# Patient Record
Sex: Female | Born: 1955 | Race: Black or African American | Hispanic: No | Smoking: Never smoker
Health system: Southern US, Community
[De-identification: ages and names within clinical notes are randomized; demographics above are authoritative.]

## PROBLEM LIST (undated history)

## (undated) DIAGNOSIS — K760 Fatty (change of) liver, not elsewhere classified: Secondary | ICD-10-CM

## (undated) DIAGNOSIS — E1143 Type 2 diabetes mellitus with diabetic autonomic (poly)neuropathy: Secondary | ICD-10-CM

## (undated) DIAGNOSIS — G4733 Obstructive sleep apnea (adult) (pediatric): Secondary | ICD-10-CM

## (undated) DIAGNOSIS — K3184 Gastroparesis: Secondary | ICD-10-CM

## (undated) DIAGNOSIS — E785 Hyperlipidemia, unspecified: Secondary | ICD-10-CM

## (undated) DIAGNOSIS — I10 Essential (primary) hypertension: Secondary | ICD-10-CM

## (undated) DIAGNOSIS — E669 Obesity, unspecified: Secondary | ICD-10-CM

## (undated) HISTORY — DX: Fatty (change of) liver, not elsewhere classified: K76.0

## (undated) HISTORY — DX: Obstructive sleep apnea (adult) (pediatric): G47.33

## (undated) HISTORY — DX: Hyperlipidemia, unspecified: E78.5

## (undated) HISTORY — PX: OTHER SURGICAL HISTORY: SHX169

## (undated) HISTORY — DX: Essential (primary) hypertension: I10

## (undated) HISTORY — DX: Gastroparesis: K31.84

## (undated) HISTORY — PX: TONSILLECTOMY: SUR1361

## (undated) HISTORY — DX: Type 2 diabetes mellitus with diabetic autonomic (poly)neuropathy: E11.43

## (undated) HISTORY — DX: Obesity, unspecified: E66.9

---

## 1998-08-15 ENCOUNTER — Emergency Department (HOSPITAL_COMMUNITY): Admission: EM | Admit: 1998-08-15 | Discharge: 1998-08-15 | Payer: Self-pay | Admitting: *Deleted

## 1998-08-22 ENCOUNTER — Encounter: Admission: RE | Admit: 1998-08-22 | Discharge: 1998-08-22 | Payer: Self-pay | Admitting: Internal Medicine

## 1998-08-29 ENCOUNTER — Encounter: Admission: RE | Admit: 1998-08-29 | Discharge: 1998-08-29 | Payer: Self-pay | Admitting: Internal Medicine

## 1998-09-12 ENCOUNTER — Encounter: Admission: RE | Admit: 1998-09-12 | Discharge: 1998-09-12 | Payer: Self-pay | Admitting: Internal Medicine

## 1998-09-16 ENCOUNTER — Ambulatory Visit (HOSPITAL_COMMUNITY): Admission: RE | Admit: 1998-09-16 | Discharge: 1998-09-16 | Payer: Self-pay | Admitting: Internal Medicine

## 1998-09-16 ENCOUNTER — Encounter: Payer: Self-pay | Admitting: Internal Medicine

## 1998-09-20 ENCOUNTER — Encounter: Admission: RE | Admit: 1998-09-20 | Discharge: 1998-09-20 | Payer: Self-pay | Admitting: Obstetrics & Gynecology

## 1998-09-26 ENCOUNTER — Encounter: Admission: RE | Admit: 1998-09-26 | Discharge: 1998-12-25 | Payer: Self-pay | Admitting: *Deleted

## 1998-10-17 ENCOUNTER — Encounter: Admission: RE | Admit: 1998-10-17 | Discharge: 1998-10-17 | Payer: Self-pay | Admitting: Internal Medicine

## 1998-10-31 ENCOUNTER — Encounter: Admission: RE | Admit: 1998-10-31 | Discharge: 1998-10-31 | Payer: Self-pay | Admitting: Internal Medicine

## 1998-11-14 ENCOUNTER — Encounter: Admission: RE | Admit: 1998-11-14 | Discharge: 1998-11-14 | Payer: Self-pay | Admitting: Hematology and Oncology

## 1998-12-13 ENCOUNTER — Other Ambulatory Visit: Admission: RE | Admit: 1998-12-13 | Discharge: 1998-12-13 | Payer: Self-pay | Admitting: *Deleted

## 1998-12-13 ENCOUNTER — Encounter: Admission: RE | Admit: 1998-12-13 | Discharge: 1998-12-13 | Payer: Self-pay | Admitting: Obstetrics & Gynecology

## 1998-12-19 ENCOUNTER — Encounter: Admission: RE | Admit: 1998-12-19 | Discharge: 1998-12-19 | Payer: Self-pay | Admitting: Internal Medicine

## 1999-03-16 ENCOUNTER — Encounter: Admission: RE | Admit: 1999-03-16 | Discharge: 1999-03-16 | Payer: Self-pay | Admitting: Internal Medicine

## 1999-04-11 ENCOUNTER — Encounter: Admission: RE | Admit: 1999-04-11 | Discharge: 1999-04-11 | Payer: Self-pay | Admitting: Obstetrics & Gynecology

## 1999-04-17 ENCOUNTER — Encounter: Admission: RE | Admit: 1999-04-17 | Discharge: 1999-04-17 | Payer: Self-pay | Admitting: Internal Medicine

## 1999-04-19 ENCOUNTER — Ambulatory Visit (HOSPITAL_COMMUNITY): Admission: RE | Admit: 1999-04-19 | Discharge: 1999-04-19 | Payer: Self-pay | Admitting: Emergency Medicine

## 1999-04-19 ENCOUNTER — Encounter: Payer: Self-pay | Admitting: Internal Medicine

## 1999-04-25 ENCOUNTER — Encounter: Admission: RE | Admit: 1999-04-25 | Discharge: 1999-04-25 | Payer: Self-pay | Admitting: Hematology and Oncology

## 1999-05-02 ENCOUNTER — Encounter: Admission: RE | Admit: 1999-05-02 | Discharge: 1999-05-02 | Payer: Self-pay | Admitting: Internal Medicine

## 1999-05-09 ENCOUNTER — Encounter: Admission: RE | Admit: 1999-05-09 | Discharge: 1999-05-09 | Payer: Self-pay | Admitting: Internal Medicine

## 1999-07-10 ENCOUNTER — Encounter: Admission: RE | Admit: 1999-07-10 | Discharge: 1999-07-10 | Payer: Self-pay | Admitting: Internal Medicine

## 1999-08-01 ENCOUNTER — Encounter: Admission: RE | Admit: 1999-08-01 | Discharge: 1999-08-01 | Payer: Self-pay | Admitting: Emergency Medicine

## 1999-08-23 ENCOUNTER — Encounter: Admission: RE | Admit: 1999-08-23 | Discharge: 1999-08-23 | Payer: Self-pay | Admitting: Hematology and Oncology

## 1999-08-23 ENCOUNTER — Encounter: Payer: Self-pay | Admitting: Hematology and Oncology

## 1999-08-23 ENCOUNTER — Ambulatory Visit (HOSPITAL_COMMUNITY): Admission: RE | Admit: 1999-08-23 | Discharge: 1999-08-23 | Payer: Self-pay | Admitting: Hematology and Oncology

## 1999-08-31 ENCOUNTER — Encounter: Admission: RE | Admit: 1999-08-31 | Discharge: 1999-08-31 | Payer: Self-pay | Admitting: Hematology and Oncology

## 1999-09-21 ENCOUNTER — Encounter: Admission: RE | Admit: 1999-09-21 | Discharge: 1999-09-21 | Payer: Self-pay | Admitting: Internal Medicine

## 1999-09-28 ENCOUNTER — Encounter: Admission: RE | Admit: 1999-09-28 | Discharge: 1999-09-28 | Payer: Self-pay | Admitting: Hematology and Oncology

## 1999-10-11 ENCOUNTER — Ambulatory Visit (HOSPITAL_COMMUNITY): Admission: RE | Admit: 1999-10-11 | Discharge: 1999-10-11 | Payer: Self-pay | Admitting: Internal Medicine

## 1999-11-08 ENCOUNTER — Encounter: Admission: RE | Admit: 1999-11-08 | Discharge: 1999-11-08 | Payer: Self-pay | Admitting: Internal Medicine

## 1999-11-28 ENCOUNTER — Emergency Department (HOSPITAL_COMMUNITY): Admission: EM | Admit: 1999-11-28 | Discharge: 1999-11-28 | Payer: Self-pay | Admitting: Emergency Medicine

## 1999-11-30 ENCOUNTER — Encounter: Admission: RE | Admit: 1999-11-30 | Discharge: 1999-11-30 | Payer: Self-pay

## 1999-12-07 ENCOUNTER — Encounter: Admission: RE | Admit: 1999-12-07 | Discharge: 1999-12-07 | Payer: Self-pay | Admitting: Hematology and Oncology

## 1999-12-12 ENCOUNTER — Encounter: Admission: RE | Admit: 1999-12-12 | Discharge: 1999-12-12 | Payer: Self-pay | Admitting: Hematology and Oncology

## 1999-12-15 ENCOUNTER — Encounter: Admission: RE | Admit: 1999-12-15 | Discharge: 1999-12-15 | Payer: Self-pay

## 1999-12-18 ENCOUNTER — Encounter (INDEPENDENT_AMBULATORY_CARE_PROVIDER_SITE_OTHER): Payer: Self-pay | Admitting: Specialist

## 1999-12-18 ENCOUNTER — Encounter: Payer: Self-pay | Admitting: Gastroenterology

## 1999-12-18 ENCOUNTER — Ambulatory Visit (HOSPITAL_COMMUNITY): Admission: RE | Admit: 1999-12-18 | Discharge: 1999-12-18 | Payer: Self-pay | Admitting: Gastroenterology

## 1999-12-22 ENCOUNTER — Encounter: Admission: RE | Admit: 1999-12-22 | Discharge: 1999-12-22 | Payer: Self-pay | Admitting: Internal Medicine

## 2000-01-24 ENCOUNTER — Encounter: Admission: RE | Admit: 2000-01-24 | Discharge: 2000-01-24 | Payer: Self-pay

## 2000-02-15 ENCOUNTER — Other Ambulatory Visit: Admission: RE | Admit: 2000-02-15 | Discharge: 2000-02-15 | Payer: Self-pay | Admitting: Obstetrics

## 2000-02-15 ENCOUNTER — Encounter: Admission: RE | Admit: 2000-02-15 | Discharge: 2000-02-15 | Payer: Self-pay | Admitting: Obstetrics

## 2000-04-10 ENCOUNTER — Encounter: Admission: RE | Admit: 2000-04-10 | Discharge: 2000-04-10 | Payer: Self-pay | Admitting: Hematology and Oncology

## 2000-05-15 ENCOUNTER — Encounter: Admission: RE | Admit: 2000-05-15 | Discharge: 2000-05-15 | Payer: Self-pay | Admitting: Hematology and Oncology

## 2000-08-06 ENCOUNTER — Other Ambulatory Visit: Admission: RE | Admit: 2000-08-06 | Discharge: 2000-08-06 | Payer: Self-pay | Admitting: Family Medicine

## 2000-08-06 ENCOUNTER — Encounter: Admission: RE | Admit: 2000-08-06 | Discharge: 2000-08-06 | Payer: Self-pay | Admitting: Obstetrics & Gynecology

## 2000-08-06 DIAGNOSIS — R8789 Other abnormal findings in specimens from female genital organs: Secondary | ICD-10-CM

## 2000-08-19 ENCOUNTER — Ambulatory Visit (HOSPITAL_COMMUNITY): Admission: RE | Admit: 2000-08-19 | Discharge: 2000-08-19 | Payer: Self-pay | Admitting: Internal Medicine

## 2000-08-19 ENCOUNTER — Encounter: Admission: RE | Admit: 2000-08-19 | Discharge: 2000-08-19 | Payer: Self-pay | Admitting: Internal Medicine

## 2000-08-19 ENCOUNTER — Encounter: Payer: Self-pay | Admitting: Obstetrics & Gynecology

## 2000-09-06 ENCOUNTER — Other Ambulatory Visit: Admission: RE | Admit: 2000-09-06 | Discharge: 2000-09-06 | Payer: Self-pay | Admitting: Obstetrics & Gynecology

## 2000-09-06 ENCOUNTER — Encounter: Admission: RE | Admit: 2000-09-06 | Discharge: 2000-09-06 | Payer: Self-pay | Admitting: Obstetrics & Gynecology

## 2000-10-31 ENCOUNTER — Ambulatory Visit (HOSPITAL_COMMUNITY): Admission: RE | Admit: 2000-10-31 | Discharge: 2000-10-31 | Payer: Self-pay | Admitting: Obstetrics & Gynecology

## 2000-11-22 ENCOUNTER — Encounter: Admission: RE | Admit: 2000-11-22 | Discharge: 2000-11-22 | Payer: Self-pay | Admitting: Obstetrics & Gynecology

## 2000-11-25 ENCOUNTER — Encounter: Payer: Self-pay | Admitting: Obstetrics & Gynecology

## 2000-11-25 ENCOUNTER — Ambulatory Visit (HOSPITAL_COMMUNITY): Admission: RE | Admit: 2000-11-25 | Discharge: 2000-11-25 | Payer: Self-pay | Admitting: Obstetrics & Gynecology

## 2001-05-06 HISTORY — PX: ABDOMINAL HYSTERECTOMY: SHX81

## 2001-05-08 ENCOUNTER — Encounter: Admission: RE | Admit: 2001-05-08 | Discharge: 2001-05-08 | Payer: Self-pay | Admitting: *Deleted

## 2001-05-10 ENCOUNTER — Encounter: Payer: Self-pay | Admitting: Emergency Medicine

## 2001-05-10 ENCOUNTER — Emergency Department (HOSPITAL_COMMUNITY): Admission: EM | Admit: 2001-05-10 | Discharge: 2001-05-10 | Payer: Self-pay

## 2001-05-22 ENCOUNTER — Ambulatory Visit (HOSPITAL_COMMUNITY): Admission: RE | Admit: 2001-05-22 | Discharge: 2001-05-22 | Payer: Self-pay | Admitting: Internal Medicine

## 2001-06-06 ENCOUNTER — Encounter: Admission: RE | Admit: 2001-06-06 | Discharge: 2001-06-06 | Payer: Self-pay | Admitting: *Deleted

## 2001-06-06 ENCOUNTER — Other Ambulatory Visit: Admission: RE | Admit: 2001-06-06 | Discharge: 2001-06-06 | Payer: Self-pay | Admitting: *Deleted

## 2001-06-06 ENCOUNTER — Encounter (INDEPENDENT_AMBULATORY_CARE_PROVIDER_SITE_OTHER): Payer: Self-pay | Admitting: Specialist

## 2001-06-13 ENCOUNTER — Encounter: Admission: RE | Admit: 2001-06-13 | Discharge: 2001-06-13 | Payer: Self-pay | Admitting: *Deleted

## 2001-06-13 ENCOUNTER — Ambulatory Visit (HOSPITAL_COMMUNITY): Admission: RE | Admit: 2001-06-13 | Discharge: 2001-06-13 | Payer: Self-pay | Admitting: Internal Medicine

## 2001-06-19 ENCOUNTER — Encounter: Admission: RE | Admit: 2001-06-19 | Discharge: 2001-06-19 | Payer: Self-pay | Admitting: Obstetrics and Gynecology

## 2001-07-07 ENCOUNTER — Encounter: Admission: RE | Admit: 2001-07-07 | Discharge: 2001-07-07 | Payer: Self-pay | Admitting: Internal Medicine

## 2001-07-15 ENCOUNTER — Encounter: Admission: RE | Admit: 2001-07-15 | Discharge: 2001-07-15 | Payer: Self-pay | Admitting: *Deleted

## 2001-07-15 ENCOUNTER — Encounter: Admission: RE | Admit: 2001-07-15 | Discharge: 2001-07-15 | Payer: Self-pay | Admitting: Internal Medicine

## 2001-08-21 ENCOUNTER — Ambulatory Visit (HOSPITAL_COMMUNITY): Admission: RE | Admit: 2001-08-21 | Discharge: 2001-08-21 | Payer: Self-pay | Admitting: Obstetrics and Gynecology

## 2001-08-21 ENCOUNTER — Encounter: Admission: RE | Admit: 2001-08-21 | Discharge: 2001-08-21 | Payer: Self-pay | Admitting: Obstetrics and Gynecology

## 2001-08-22 ENCOUNTER — Ambulatory Visit (HOSPITAL_COMMUNITY): Admission: RE | Admit: 2001-08-22 | Discharge: 2001-08-22 | Payer: Self-pay | Admitting: Obstetrics

## 2001-08-28 ENCOUNTER — Inpatient Hospital Stay (HOSPITAL_COMMUNITY): Admission: RE | Admit: 2001-08-28 | Discharge: 2001-09-02 | Payer: Self-pay | Admitting: Obstetrics and Gynecology

## 2001-08-28 ENCOUNTER — Encounter (INDEPENDENT_AMBULATORY_CARE_PROVIDER_SITE_OTHER): Payer: Self-pay

## 2001-08-30 ENCOUNTER — Encounter: Payer: Self-pay | Admitting: Obstetrics and Gynecology

## 2001-09-01 ENCOUNTER — Encounter: Payer: Self-pay | Admitting: Obstetrics and Gynecology

## 2001-09-07 ENCOUNTER — Encounter: Payer: Self-pay | Admitting: Emergency Medicine

## 2001-09-07 ENCOUNTER — Emergency Department (HOSPITAL_COMMUNITY): Admission: EM | Admit: 2001-09-07 | Discharge: 2001-09-07 | Payer: Self-pay | Admitting: Emergency Medicine

## 2001-09-18 ENCOUNTER — Encounter: Admission: RE | Admit: 2001-09-18 | Discharge: 2001-09-18 | Payer: Self-pay | Admitting: Obstetrics and Gynecology

## 2001-10-03 ENCOUNTER — Encounter: Admission: RE | Admit: 2001-10-03 | Discharge: 2001-10-03 | Payer: Self-pay | Admitting: *Deleted

## 2001-10-31 ENCOUNTER — Encounter: Admission: RE | Admit: 2001-10-31 | Discharge: 2001-10-31 | Payer: Self-pay | Admitting: Internal Medicine

## 2001-12-19 ENCOUNTER — Encounter: Payer: Self-pay | Admitting: *Deleted

## 2001-12-19 ENCOUNTER — Emergency Department (HOSPITAL_COMMUNITY): Admission: EM | Admit: 2001-12-19 | Discharge: 2001-12-19 | Payer: Self-pay | Admitting: *Deleted

## 2001-12-22 ENCOUNTER — Encounter: Admission: RE | Admit: 2001-12-22 | Discharge: 2001-12-22 | Payer: Self-pay | Admitting: Internal Medicine

## 2002-05-09 ENCOUNTER — Emergency Department (HOSPITAL_COMMUNITY): Admission: EM | Admit: 2002-05-09 | Discharge: 2002-05-09 | Payer: Self-pay | Admitting: *Deleted

## 2002-05-13 ENCOUNTER — Encounter: Admission: RE | Admit: 2002-05-13 | Discharge: 2002-05-13 | Payer: Self-pay | Admitting: Internal Medicine

## 2002-07-02 ENCOUNTER — Encounter: Admission: RE | Admit: 2002-07-02 | Discharge: 2002-07-02 | Payer: Self-pay | Admitting: Internal Medicine

## 2002-07-23 ENCOUNTER — Ambulatory Visit (HOSPITAL_BASED_OUTPATIENT_CLINIC_OR_DEPARTMENT_OTHER): Admission: RE | Admit: 2002-07-23 | Discharge: 2002-07-23 | Payer: Self-pay | Admitting: Anesthesiology

## 2002-07-23 ENCOUNTER — Encounter: Admission: RE | Admit: 2002-07-23 | Discharge: 2002-07-23 | Payer: Self-pay | Admitting: Internal Medicine

## 2002-08-24 ENCOUNTER — Ambulatory Visit (HOSPITAL_COMMUNITY): Admission: RE | Admit: 2002-08-24 | Discharge: 2002-08-24 | Payer: Self-pay | Admitting: Internal Medicine

## 2002-10-30 ENCOUNTER — Encounter: Admission: RE | Admit: 2002-10-30 | Discharge: 2002-10-30 | Payer: Self-pay | Admitting: Internal Medicine

## 2002-12-10 ENCOUNTER — Encounter: Admission: RE | Admit: 2002-12-10 | Discharge: 2002-12-10 | Payer: Self-pay | Admitting: Internal Medicine

## 2003-04-07 ENCOUNTER — Encounter: Admission: RE | Admit: 2003-04-07 | Discharge: 2003-04-07 | Payer: Self-pay | Admitting: Internal Medicine

## 2003-04-14 ENCOUNTER — Encounter: Admission: RE | Admit: 2003-04-14 | Discharge: 2003-04-14 | Payer: Self-pay | Admitting: Internal Medicine

## 2003-06-17 ENCOUNTER — Encounter: Admission: RE | Admit: 2003-06-17 | Discharge: 2003-06-17 | Payer: Self-pay | Admitting: Internal Medicine

## 2003-07-13 ENCOUNTER — Ambulatory Visit (HOSPITAL_COMMUNITY): Admission: RE | Admit: 2003-07-13 | Discharge: 2003-07-13 | Payer: Self-pay | Admitting: Internal Medicine

## 2003-07-22 ENCOUNTER — Encounter: Payer: Self-pay | Admitting: Cardiology

## 2003-07-22 ENCOUNTER — Ambulatory Visit (HOSPITAL_COMMUNITY): Admission: RE | Admit: 2003-07-22 | Discharge: 2003-07-22 | Payer: Self-pay | Admitting: Family Medicine

## 2003-07-29 ENCOUNTER — Encounter: Admission: RE | Admit: 2003-07-29 | Discharge: 2003-07-29 | Payer: Self-pay | Admitting: Internal Medicine

## 2003-08-12 ENCOUNTER — Encounter: Admission: RE | Admit: 2003-08-12 | Discharge: 2003-08-12 | Payer: Self-pay | Admitting: Internal Medicine

## 2003-08-25 ENCOUNTER — Ambulatory Visit (HOSPITAL_COMMUNITY): Admission: RE | Admit: 2003-08-25 | Discharge: 2003-08-25 | Payer: Self-pay | Admitting: Internal Medicine

## 2003-09-01 ENCOUNTER — Emergency Department (HOSPITAL_COMMUNITY): Admission: EM | Admit: 2003-09-01 | Discharge: 2003-09-01 | Payer: Self-pay | Admitting: Family Medicine

## 2003-09-15 ENCOUNTER — Ambulatory Visit (HOSPITAL_COMMUNITY): Admission: RE | Admit: 2003-09-15 | Discharge: 2003-09-16 | Payer: Self-pay | Admitting: Otolaryngology

## 2003-09-15 ENCOUNTER — Encounter (INDEPENDENT_AMBULATORY_CARE_PROVIDER_SITE_OTHER): Payer: Self-pay | Admitting: *Deleted

## 2003-09-30 ENCOUNTER — Emergency Department (HOSPITAL_COMMUNITY): Admission: EM | Admit: 2003-09-30 | Discharge: 2003-09-30 | Payer: Self-pay | Admitting: Emergency Medicine

## 2003-10-18 ENCOUNTER — Ambulatory Visit: Payer: Self-pay | Admitting: Internal Medicine

## 2003-10-19 ENCOUNTER — Ambulatory Visit: Payer: Self-pay | Admitting: Internal Medicine

## 2003-11-17 ENCOUNTER — Ambulatory Visit (HOSPITAL_COMMUNITY): Admission: RE | Admit: 2003-11-17 | Discharge: 2003-11-17 | Payer: Self-pay | Admitting: Internal Medicine

## 2003-11-17 ENCOUNTER — Ambulatory Visit: Payer: Self-pay | Admitting: Internal Medicine

## 2003-11-19 ENCOUNTER — Ambulatory Visit (HOSPITAL_BASED_OUTPATIENT_CLINIC_OR_DEPARTMENT_OTHER): Admission: RE | Admit: 2003-11-19 | Discharge: 2003-11-19 | Payer: Self-pay | Admitting: Otolaryngology

## 2004-02-10 ENCOUNTER — Ambulatory Visit: Payer: Self-pay | Admitting: Internal Medicine

## 2004-02-21 ENCOUNTER — Ambulatory Visit: Payer: Self-pay | Admitting: Internal Medicine

## 2004-04-17 ENCOUNTER — Encounter: Admission: RE | Admit: 2004-04-17 | Discharge: 2004-04-17 | Payer: Self-pay | Admitting: Otolaryngology

## 2004-07-07 ENCOUNTER — Ambulatory Visit: Payer: Self-pay | Admitting: Internal Medicine

## 2004-07-21 ENCOUNTER — Ambulatory Visit: Payer: Self-pay | Admitting: Internal Medicine

## 2004-08-24 ENCOUNTER — Ambulatory Visit (HOSPITAL_COMMUNITY): Admission: RE | Admit: 2004-08-24 | Discharge: 2004-08-24 | Payer: Self-pay | Admitting: Obstetrics & Gynecology

## 2004-09-07 ENCOUNTER — Ambulatory Visit: Payer: Self-pay | Admitting: Internal Medicine

## 2004-09-28 ENCOUNTER — Ambulatory Visit: Payer: Self-pay | Admitting: Internal Medicine

## 2004-12-21 ENCOUNTER — Ambulatory Visit: Payer: Self-pay | Admitting: Internal Medicine

## 2004-12-26 ENCOUNTER — Ambulatory Visit: Payer: Self-pay | Admitting: Gastroenterology

## 2005-01-18 ENCOUNTER — Ambulatory Visit: Payer: Self-pay | Admitting: Internal Medicine

## 2005-01-23 ENCOUNTER — Ambulatory Visit: Payer: Self-pay | Admitting: Gastroenterology

## 2005-04-12 ENCOUNTER — Ambulatory Visit: Payer: Self-pay | Admitting: Internal Medicine

## 2005-10-02 ENCOUNTER — Ambulatory Visit: Payer: Self-pay | Admitting: Hospitalist

## 2005-10-19 ENCOUNTER — Ambulatory Visit: Payer: Self-pay | Admitting: Internal Medicine

## 2005-11-01 ENCOUNTER — Ambulatory Visit (HOSPITAL_COMMUNITY): Admission: RE | Admit: 2005-11-01 | Discharge: 2005-11-01 | Payer: Self-pay | Admitting: Internal Medicine

## 2005-11-01 ENCOUNTER — Ambulatory Visit: Payer: Self-pay | Admitting: Gastroenterology

## 2005-11-08 ENCOUNTER — Ambulatory Visit: Payer: Self-pay | Admitting: Internal Medicine

## 2005-11-09 DIAGNOSIS — J309 Allergic rhinitis, unspecified: Secondary | ICD-10-CM | POA: Insufficient documentation

## 2005-11-09 DIAGNOSIS — I10 Essential (primary) hypertension: Secondary | ICD-10-CM | POA: Insufficient documentation

## 2005-11-09 DIAGNOSIS — K219 Gastro-esophageal reflux disease without esophagitis: Secondary | ICD-10-CM | POA: Insufficient documentation

## 2005-11-09 DIAGNOSIS — G4730 Sleep apnea, unspecified: Secondary | ICD-10-CM | POA: Insufficient documentation

## 2005-11-09 DIAGNOSIS — E785 Hyperlipidemia, unspecified: Secondary | ICD-10-CM | POA: Insufficient documentation

## 2005-11-09 DIAGNOSIS — E669 Obesity, unspecified: Secondary | ICD-10-CM

## 2005-11-09 DIAGNOSIS — K7689 Other specified diseases of liver: Secondary | ICD-10-CM

## 2005-11-09 DIAGNOSIS — Z9889 Other specified postprocedural states: Secondary | ICD-10-CM

## 2005-11-09 DIAGNOSIS — E781 Pure hyperglyceridemia: Secondary | ICD-10-CM

## 2005-11-09 DIAGNOSIS — Z8719 Personal history of other diseases of the digestive system: Secondary | ICD-10-CM

## 2005-11-29 ENCOUNTER — Ambulatory Visit (HOSPITAL_COMMUNITY): Admission: RE | Admit: 2005-11-29 | Discharge: 2005-11-29 | Payer: Self-pay | Admitting: Internal Medicine

## 2006-01-28 DIAGNOSIS — E1149 Type 2 diabetes mellitus with other diabetic neurological complication: Secondary | ICD-10-CM | POA: Insufficient documentation

## 2006-01-28 DIAGNOSIS — K8020 Calculus of gallbladder without cholecystitis without obstruction: Secondary | ICD-10-CM | POA: Insufficient documentation

## 2006-01-28 DIAGNOSIS — M6740 Ganglion, unspecified site: Secondary | ICD-10-CM

## 2006-04-18 ENCOUNTER — Telehealth: Payer: Self-pay | Admitting: *Deleted

## 2006-07-18 ENCOUNTER — Telehealth: Payer: Self-pay | Admitting: *Deleted

## 2006-07-22 ENCOUNTER — Telehealth: Payer: Self-pay | Admitting: *Deleted

## 2006-08-21 ENCOUNTER — Ambulatory Visit: Payer: Self-pay | Admitting: Hospitalist

## 2006-08-21 ENCOUNTER — Encounter (INDEPENDENT_AMBULATORY_CARE_PROVIDER_SITE_OTHER): Payer: Self-pay | Admitting: *Deleted

## 2006-08-21 ENCOUNTER — Ambulatory Visit (HOSPITAL_COMMUNITY): Admission: RE | Admit: 2006-08-21 | Discharge: 2006-08-21 | Payer: Self-pay | Admitting: Hospitalist

## 2006-08-21 LAB — CONVERTED CEMR LAB

## 2006-08-22 ENCOUNTER — Encounter (INDEPENDENT_AMBULATORY_CARE_PROVIDER_SITE_OTHER): Payer: Self-pay | Admitting: *Deleted

## 2006-08-26 ENCOUNTER — Ambulatory Visit: Payer: Self-pay | Admitting: *Deleted

## 2006-08-26 ENCOUNTER — Encounter (INDEPENDENT_AMBULATORY_CARE_PROVIDER_SITE_OTHER): Payer: Self-pay | Admitting: *Deleted

## 2006-08-26 LAB — CONVERTED CEMR LAB
CO2: 25 meq/L (ref 19–32)
Chloride: 103 meq/L (ref 96–112)
Creatinine, Ser: 1.15 mg/dL (ref 0.40–1.20)
Sodium: 139 meq/L (ref 135–145)

## 2006-08-28 ENCOUNTER — Telehealth: Payer: Self-pay | Admitting: *Deleted

## 2006-08-29 ENCOUNTER — Telehealth (INDEPENDENT_AMBULATORY_CARE_PROVIDER_SITE_OTHER): Payer: Self-pay | Admitting: *Deleted

## 2006-09-05 ENCOUNTER — Encounter (INDEPENDENT_AMBULATORY_CARE_PROVIDER_SITE_OTHER): Payer: Self-pay | Admitting: *Deleted

## 2006-09-12 ENCOUNTER — Encounter (INDEPENDENT_AMBULATORY_CARE_PROVIDER_SITE_OTHER): Payer: Self-pay | Admitting: *Deleted

## 2006-09-12 ENCOUNTER — Ambulatory Visit: Payer: Self-pay | Admitting: Infectious Disease

## 2006-09-12 LAB — CONVERTED CEMR LAB: Blood Glucose, Fingerstick: 79

## 2006-10-22 ENCOUNTER — Telehealth (INDEPENDENT_AMBULATORY_CARE_PROVIDER_SITE_OTHER): Payer: Self-pay | Admitting: *Deleted

## 2006-12-04 ENCOUNTER — Ambulatory Visit (HOSPITAL_COMMUNITY): Admission: RE | Admit: 2006-12-04 | Discharge: 2006-12-04 | Payer: Self-pay | Admitting: *Deleted

## 2006-12-24 ENCOUNTER — Telehealth (INDEPENDENT_AMBULATORY_CARE_PROVIDER_SITE_OTHER): Payer: Self-pay | Admitting: *Deleted

## 2007-01-06 ENCOUNTER — Encounter (INDEPENDENT_AMBULATORY_CARE_PROVIDER_SITE_OTHER): Payer: Self-pay | Admitting: *Deleted

## 2007-01-06 ENCOUNTER — Ambulatory Visit: Payer: Self-pay | Admitting: Hospitalist

## 2007-01-06 LAB — CONVERTED CEMR LAB: Hgb A1c MFr Bld: 6.7 %

## 2007-01-08 LAB — CONVERTED CEMR LAB
BUN: 17 mg/dL (ref 6–23)
CO2: 26 meq/L (ref 19–32)
Calcium: 9.6 mg/dL (ref 8.4–10.5)
Chloride: 103 meq/L (ref 96–112)
Glucose, Bld: 99 mg/dL (ref 70–99)
Potassium: 4.1 meq/L (ref 3.5–5.3)
Sodium: 142 meq/L (ref 135–145)
Total Bilirubin: 0.3 mg/dL (ref 0.3–1.2)

## 2007-03-13 ENCOUNTER — Telehealth: Payer: Self-pay | Admitting: *Deleted

## 2007-04-17 DIAGNOSIS — K5730 Diverticulosis of large intestine without perforation or abscess without bleeding: Secondary | ICD-10-CM | POA: Insufficient documentation

## 2007-04-25 ENCOUNTER — Telehealth (INDEPENDENT_AMBULATORY_CARE_PROVIDER_SITE_OTHER): Payer: Self-pay | Admitting: *Deleted

## 2007-04-28 ENCOUNTER — Telehealth (INDEPENDENT_AMBULATORY_CARE_PROVIDER_SITE_OTHER): Payer: Self-pay | Admitting: *Deleted

## 2007-05-19 ENCOUNTER — Telehealth: Payer: Self-pay | Admitting: *Deleted

## 2007-05-26 ENCOUNTER — Telehealth: Payer: Self-pay | Admitting: *Deleted

## 2007-06-25 ENCOUNTER — Telehealth (INDEPENDENT_AMBULATORY_CARE_PROVIDER_SITE_OTHER): Payer: Self-pay | Admitting: *Deleted

## 2007-07-21 ENCOUNTER — Telehealth (INDEPENDENT_AMBULATORY_CARE_PROVIDER_SITE_OTHER): Payer: Self-pay | Admitting: *Deleted

## 2007-08-18 ENCOUNTER — Telehealth (INDEPENDENT_AMBULATORY_CARE_PROVIDER_SITE_OTHER): Payer: Self-pay | Admitting: Internal Medicine

## 2007-08-25 ENCOUNTER — Telehealth (INDEPENDENT_AMBULATORY_CARE_PROVIDER_SITE_OTHER): Payer: Self-pay | Admitting: Internal Medicine

## 2007-08-26 ENCOUNTER — Telehealth (INDEPENDENT_AMBULATORY_CARE_PROVIDER_SITE_OTHER): Payer: Self-pay | Admitting: Internal Medicine

## 2007-08-29 ENCOUNTER — Telehealth (INDEPENDENT_AMBULATORY_CARE_PROVIDER_SITE_OTHER): Payer: Self-pay | Admitting: Internal Medicine

## 2007-09-06 ENCOUNTER — Encounter (INDEPENDENT_AMBULATORY_CARE_PROVIDER_SITE_OTHER): Payer: Self-pay | Admitting: Internal Medicine

## 2007-09-10 ENCOUNTER — Telehealth: Payer: Self-pay | Admitting: *Deleted

## 2007-10-03 ENCOUNTER — Encounter (INDEPENDENT_AMBULATORY_CARE_PROVIDER_SITE_OTHER): Payer: Self-pay | Admitting: Internal Medicine

## 2007-10-03 ENCOUNTER — Ambulatory Visit: Payer: Self-pay | Admitting: Internal Medicine

## 2007-10-03 DIAGNOSIS — G4700 Insomnia, unspecified: Secondary | ICD-10-CM | POA: Insufficient documentation

## 2007-10-03 DIAGNOSIS — R5381 Other malaise: Secondary | ICD-10-CM | POA: Insufficient documentation

## 2007-10-03 DIAGNOSIS — R5383 Other fatigue: Secondary | ICD-10-CM

## 2007-10-03 LAB — CONVERTED CEMR LAB
Blood Glucose, AC Bkfst: 163 mg/dL
Hgb A1c MFr Bld: 7.7 %

## 2007-10-08 LAB — CONVERTED CEMR LAB
AST: 20 units/L (ref 0–37)
Alkaline Phosphatase: 208 units/L — ABNORMAL HIGH (ref 39–117)
Basophils Absolute: 0 10*3/uL (ref 0.0–0.1)
Basophils Relative: 0 % (ref 0–1)
Chloride: 104 meq/L (ref 96–112)
Eosinophils Absolute: 0.1 10*3/uL (ref 0.0–0.7)
Eosinophils Relative: 1 % (ref 0–5)
Glucose, Bld: 150 mg/dL — ABNORMAL HIGH (ref 70–99)
HCT: 39.3 % (ref 36.0–46.0)
Hemoglobin: 12.4 g/dL (ref 12.0–15.0)
LDL Cholesterol: 110 mg/dL — ABNORMAL HIGH (ref 0–99)
MCHC: 31.6 g/dL (ref 30.0–36.0)
Monocytes Absolute: 0.5 10*3/uL (ref 0.1–1.0)
Monocytes Relative: 6 % (ref 3–12)
Platelets: 376 10*3/uL (ref 150–400)
Potassium: 5.1 meq/L (ref 3.5–5.3)
RBC: 5.26 M/uL — ABNORMAL HIGH (ref 3.87–5.11)
TSH: 1.831 microintl units/mL (ref 0.350–4.50)
Total Bilirubin: 0.4 mg/dL (ref 0.3–1.2)
Total Protein: 8 g/dL (ref 6.0–8.3)
Triglycerides: 99 mg/dL (ref <150)
WBC: 8.3 10*3/uL (ref 4.0–10.5)

## 2007-10-29 ENCOUNTER — Ambulatory Visit: Payer: Self-pay | Admitting: Internal Medicine

## 2007-10-29 LAB — CONVERTED CEMR LAB: Blood Glucose, Fingerstick: 148

## 2007-11-10 ENCOUNTER — Telehealth (INDEPENDENT_AMBULATORY_CARE_PROVIDER_SITE_OTHER): Payer: Self-pay | Admitting: *Deleted

## 2007-11-18 ENCOUNTER — Ambulatory Visit: Payer: Self-pay

## 2007-12-17 ENCOUNTER — Encounter (INDEPENDENT_AMBULATORY_CARE_PROVIDER_SITE_OTHER): Payer: Self-pay | Admitting: Internal Medicine

## 2007-12-26 ENCOUNTER — Telehealth (INDEPENDENT_AMBULATORY_CARE_PROVIDER_SITE_OTHER): Payer: Self-pay | Admitting: Internal Medicine

## 2008-01-09 ENCOUNTER — Ambulatory Visit (HOSPITAL_COMMUNITY): Admission: RE | Admit: 2008-01-09 | Discharge: 2008-01-09 | Payer: Self-pay | Admitting: Internal Medicine

## 2008-01-09 ENCOUNTER — Ambulatory Visit: Payer: Self-pay | Admitting: Internal Medicine

## 2008-01-09 ENCOUNTER — Encounter (INDEPENDENT_AMBULATORY_CARE_PROVIDER_SITE_OTHER): Payer: Self-pay | Admitting: Internal Medicine

## 2008-01-09 LAB — CONVERTED CEMR LAB
Albumin: 3.8 g/dL (ref 3.5–5.2)
Alkaline Phosphatase: 247 units/L — ABNORMAL HIGH (ref 39–117)
BUN: 13 mg/dL (ref 6–23)
Basophils Relative: 0 % (ref 0–1)
Bilirubin Urine: NEGATIVE
CO2: 29 meq/L (ref 19–32)
Calcium: 9.4 mg/dL (ref 8.4–10.5)
Creatinine, Ser: 1.19 mg/dL (ref 0.40–1.20)
Eosinophils Absolute: 0.1 10*3/uL (ref 0.0–0.7)
Glucose, Bld: 127 mg/dL — ABNORMAL HIGH (ref 70–99)
Hemoglobin: 12.3 g/dL (ref 12.0–15.0)
MCV: 76.3 fL — ABNORMAL LOW (ref 78.0–100.0)
Monocytes Absolute: 1 10*3/uL (ref 0.1–1.0)
Monocytes Relative: 8 % (ref 3–12)
Neutro Abs: 9.8 10*3/uL — ABNORMAL HIGH (ref 1.7–7.7)
Potassium: 4.1 meq/L (ref 3.5–5.3)
RBC: 5.07 M/uL (ref 3.87–5.11)
Sodium: 139 meq/L (ref 135–145)
Specific Gravity, Urine: 1.022 (ref 1.005–1.03)
WBC: 13.9 10*3/uL — ABNORMAL HIGH (ref 4.0–10.5)

## 2008-01-10 ENCOUNTER — Encounter (INDEPENDENT_AMBULATORY_CARE_PROVIDER_SITE_OTHER): Payer: Self-pay | Admitting: Internal Medicine

## 2008-01-13 ENCOUNTER — Ambulatory Visit (HOSPITAL_COMMUNITY): Admission: RE | Admit: 2008-01-13 | Discharge: 2008-01-13 | Payer: Self-pay | Admitting: Internal Medicine

## 2008-01-15 ENCOUNTER — Ambulatory Visit: Payer: Self-pay | Admitting: Internal Medicine

## 2008-01-15 LAB — CONVERTED CEMR LAB
Bilirubin Urine: NEGATIVE
Blood Glucose, Home Monitor: 1 mg/dL
Hgb A1c MFr Bld: 8.3 %
Specific Gravity, Urine: >=1.03
Urobilinogen, UA: 0.2
pH: 5

## 2008-01-28 ENCOUNTER — Encounter (INDEPENDENT_AMBULATORY_CARE_PROVIDER_SITE_OTHER): Payer: Self-pay | Admitting: Internal Medicine

## 2008-02-09 ENCOUNTER — Encounter: Payer: Self-pay | Admitting: Internal Medicine

## 2008-02-09 ENCOUNTER — Ambulatory Visit: Payer: Self-pay | Admitting: Internal Medicine

## 2008-02-09 ENCOUNTER — Telehealth: Payer: Self-pay | Admitting: *Deleted

## 2008-02-09 LAB — CONVERTED CEMR LAB
Bilirubin Urine: NEGATIVE
Blood Glucose, Fingerstick: 151
Hemoglobin, Urine: NEGATIVE
Ketones, ur: NEGATIVE mg/dL
Leukocytes, UA: NEGATIVE
Nitrite: NEGATIVE
Urine Glucose: NEGATIVE mg/dL
pH: 5.5 (ref 5.0–8.0)

## 2008-02-16 ENCOUNTER — Telehealth (INDEPENDENT_AMBULATORY_CARE_PROVIDER_SITE_OTHER): Payer: Self-pay | Admitting: Internal Medicine

## 2008-05-21 ENCOUNTER — Ambulatory Visit: Payer: Self-pay | Admitting: Internal Medicine

## 2008-05-21 ENCOUNTER — Encounter (INDEPENDENT_AMBULATORY_CARE_PROVIDER_SITE_OTHER): Payer: Self-pay | Admitting: Internal Medicine

## 2008-05-21 DIAGNOSIS — M25561 Pain in right knee: Secondary | ICD-10-CM

## 2008-05-21 DIAGNOSIS — J351 Hypertrophy of tonsils: Secondary | ICD-10-CM

## 2008-05-21 DIAGNOSIS — M25562 Pain in left knee: Secondary | ICD-10-CM

## 2008-05-23 LAB — CONVERTED CEMR LAB
ALT: 41 units/L — ABNORMAL HIGH (ref 0–35)
BUN: 21 mg/dL (ref 6–23)
Chloride: 102 meq/L (ref 96–112)
Creatinine, Ser: 1.18 mg/dL (ref 0.40–1.20)
Creatinine, Urine: 199.2 mg/dL
GFR calc Af Amer: 58 mL/min — ABNORMAL LOW (ref >60)
HDL: 47 mg/dL (ref >39)
Ketones, ur: NEGATIVE mg/dL
Microalb Creat Ratio: 31.1 mg/g — ABNORMAL HIGH (ref 0.0–30.0)
Nitrite: NEGATIVE
RBC / HPF: NONE SEEN (ref <3)
Specific Gravity, Urine: 1.026 (ref 1.005–1.030)
Urine Glucose: >1000 mg/dL — AB
pH: 5.5 (ref 5.0–8.0)

## 2008-05-24 ENCOUNTER — Telehealth: Payer: Self-pay | Admitting: *Deleted

## 2008-05-24 ENCOUNTER — Encounter (INDEPENDENT_AMBULATORY_CARE_PROVIDER_SITE_OTHER): Payer: Self-pay | Admitting: Internal Medicine

## 2008-05-27 ENCOUNTER — Encounter (INDEPENDENT_AMBULATORY_CARE_PROVIDER_SITE_OTHER): Payer: Self-pay | Admitting: Internal Medicine

## 2008-05-31 ENCOUNTER — Ambulatory Visit: Payer: Self-pay | Admitting: Sports Medicine

## 2008-06-04 ENCOUNTER — Telehealth: Payer: Self-pay | Admitting: *Deleted

## 2008-06-04 ENCOUNTER — Ambulatory Visit: Payer: Self-pay | Admitting: Infectious Disease

## 2008-06-04 LAB — CONVERTED CEMR LAB: Blood Glucose, Fingerstick: 364

## 2008-06-09 ENCOUNTER — Telehealth (INDEPENDENT_AMBULATORY_CARE_PROVIDER_SITE_OTHER): Payer: Self-pay | Admitting: Internal Medicine

## 2008-06-15 ENCOUNTER — Telehealth: Payer: Self-pay | Admitting: *Deleted

## 2008-06-25 ENCOUNTER — Encounter (INDEPENDENT_AMBULATORY_CARE_PROVIDER_SITE_OTHER): Payer: Self-pay | Admitting: Internal Medicine

## 2008-06-25 ENCOUNTER — Ambulatory Visit: Payer: Self-pay | Admitting: Internal Medicine

## 2008-07-02 ENCOUNTER — Telehealth (INDEPENDENT_AMBULATORY_CARE_PROVIDER_SITE_OTHER): Payer: Self-pay | Admitting: Internal Medicine

## 2008-07-07 ENCOUNTER — Telehealth (INDEPENDENT_AMBULATORY_CARE_PROVIDER_SITE_OTHER): Payer: Self-pay | Admitting: Internal Medicine

## 2008-07-09 ENCOUNTER — Ambulatory Visit: Payer: Self-pay | Admitting: Internal Medicine

## 2008-08-10 ENCOUNTER — Telehealth (INDEPENDENT_AMBULATORY_CARE_PROVIDER_SITE_OTHER): Payer: Self-pay | Admitting: Internal Medicine

## 2008-08-11 ENCOUNTER — Telehealth (INDEPENDENT_AMBULATORY_CARE_PROVIDER_SITE_OTHER): Payer: Self-pay | Admitting: Internal Medicine

## 2008-11-04 ENCOUNTER — Telehealth (INDEPENDENT_AMBULATORY_CARE_PROVIDER_SITE_OTHER): Payer: Self-pay | Admitting: Internal Medicine

## 2008-11-18 ENCOUNTER — Telehealth (INDEPENDENT_AMBULATORY_CARE_PROVIDER_SITE_OTHER): Payer: Self-pay | Admitting: Internal Medicine

## 2008-12-01 ENCOUNTER — Ambulatory Visit: Payer: Self-pay | Admitting: Internal Medicine

## 2008-12-01 ENCOUNTER — Encounter: Payer: Self-pay | Admitting: Internal Medicine

## 2008-12-01 LAB — CONVERTED CEMR LAB
Blood Glucose, Fingerstick: 139
Hgb A1c MFr Bld: 7.6 %

## 2009-01-10 ENCOUNTER — Telehealth (INDEPENDENT_AMBULATORY_CARE_PROVIDER_SITE_OTHER): Payer: Self-pay | Admitting: Internal Medicine

## 2009-01-27 ENCOUNTER — Telehealth (INDEPENDENT_AMBULATORY_CARE_PROVIDER_SITE_OTHER): Payer: Self-pay | Admitting: Internal Medicine

## 2009-02-07 ENCOUNTER — Telehealth (INDEPENDENT_AMBULATORY_CARE_PROVIDER_SITE_OTHER): Payer: Self-pay | Admitting: Internal Medicine

## 2009-04-11 ENCOUNTER — Encounter (INDEPENDENT_AMBULATORY_CARE_PROVIDER_SITE_OTHER): Payer: Self-pay | Admitting: Internal Medicine

## 2009-04-11 ENCOUNTER — Encounter: Payer: Self-pay | Admitting: Internal Medicine

## 2009-04-11 ENCOUNTER — Observation Stay (HOSPITAL_COMMUNITY): Admission: AD | Admit: 2009-04-11 | Discharge: 2009-04-13 | Payer: Self-pay | Admitting: Internal Medicine

## 2009-04-11 ENCOUNTER — Ambulatory Visit: Payer: Self-pay | Admitting: Infectious Diseases

## 2009-04-11 ENCOUNTER — Ambulatory Visit: Payer: Self-pay | Admitting: Internal Medicine

## 2009-04-11 DIAGNOSIS — R0609 Other forms of dyspnea: Secondary | ICD-10-CM

## 2009-04-11 DIAGNOSIS — R0989 Other specified symptoms and signs involving the circulatory and respiratory systems: Secondary | ICD-10-CM

## 2009-04-11 LAB — CONVERTED CEMR LAB
Alkaline Phosphatase: 208 units/L — ABNORMAL HIGH (ref 39–117)
Bilirubin Urine: NEGATIVE
Blood Glucose, Fingerstick: 60
CO2: 28 meq/L (ref 19–32)
Calcium: 8.7 mg/dL (ref 8.4–10.5)
Chloride: 104 meq/L (ref 96–112)
Creatinine, Ser: 1.28 mg/dL — ABNORMAL HIGH (ref 0.40–1.20)
HCT: 37.4 % (ref 36.0–46.0)
Leukocytes, UA: NEGATIVE
Lymphocytes Relative: 21 % (ref 12–46)
Lymphs Abs: 2.4 10*3/uL (ref 0.7–4.0)
MCHC: 32.6 g/dL (ref 30.0–36.0)
MCV: 78.5 fL (ref >=78.0)
Neutro Abs: 8 10*3/uL — ABNORMAL HIGH (ref 1.7–7.7)
Platelets: 339 10*3/uL (ref 150–400)
Potassium: 4 meq/L (ref 3.5–5.3)
Pro B Natriuretic peptide (BNP): <30 pg/mL (ref 0.0–100.0)
RDW: 16.2 % — ABNORMAL HIGH (ref 11.5–15.5)
Total Bilirubin: 0.5 mg/dL (ref 0.3–1.2)
Total Protein: 7.7 g/dL (ref 6.0–8.3)
Urine Glucose: NEGATIVE mg/dL
Urobilinogen, UA: 1 (ref 0.0–1.0)
WBC: 11.4 10*3/uL — ABNORMAL HIGH (ref 4.0–10.5)
pH: 6 (ref 5.0–8.0)

## 2009-04-12 ENCOUNTER — Ambulatory Visit: Payer: Self-pay | Admitting: Vascular Surgery

## 2009-04-12 ENCOUNTER — Encounter: Payer: Self-pay | Admitting: Internal Medicine

## 2009-04-13 ENCOUNTER — Telehealth (INDEPENDENT_AMBULATORY_CARE_PROVIDER_SITE_OTHER): Payer: Self-pay | Admitting: Internal Medicine

## 2009-04-19 ENCOUNTER — Encounter (INDEPENDENT_AMBULATORY_CARE_PROVIDER_SITE_OTHER): Payer: Self-pay | Admitting: Internal Medicine

## 2009-04-20 ENCOUNTER — Ambulatory Visit: Payer: Self-pay | Admitting: Internal Medicine

## 2009-04-20 LAB — CONVERTED CEMR LAB: Pap Smear: NEGATIVE

## 2009-04-21 LAB — CONVERTED CEMR LAB
Chlamydia, DNA Probe: NEGATIVE
GC Probe Amp, Genital: NEGATIVE

## 2009-04-27 ENCOUNTER — Ambulatory Visit (HOSPITAL_COMMUNITY): Admission: RE | Admit: 2009-04-27 | Discharge: 2009-04-27 | Payer: Self-pay | Admitting: Internal Medicine

## 2009-04-29 ENCOUNTER — Ambulatory Visit (HOSPITAL_COMMUNITY): Admission: RE | Admit: 2009-04-29 | Discharge: 2009-04-29 | Payer: Self-pay | Admitting: Internal Medicine

## 2009-04-29 ENCOUNTER — Encounter (INDEPENDENT_AMBULATORY_CARE_PROVIDER_SITE_OTHER): Payer: Self-pay | Admitting: Internal Medicine

## 2009-05-03 ENCOUNTER — Encounter (INDEPENDENT_AMBULATORY_CARE_PROVIDER_SITE_OTHER): Payer: Self-pay | Admitting: Internal Medicine

## 2009-05-03 ENCOUNTER — Ambulatory Visit (HOSPITAL_BASED_OUTPATIENT_CLINIC_OR_DEPARTMENT_OTHER): Admission: RE | Admit: 2009-05-03 | Discharge: 2009-05-03 | Payer: Self-pay | Admitting: Internal Medicine

## 2009-05-09 ENCOUNTER — Ambulatory Visit: Payer: Self-pay | Admitting: Sports Medicine

## 2009-05-14 ENCOUNTER — Ambulatory Visit: Payer: Self-pay | Admitting: Internal Medicine

## 2009-05-16 ENCOUNTER — Telehealth (INDEPENDENT_AMBULATORY_CARE_PROVIDER_SITE_OTHER): Payer: Self-pay | Admitting: Internal Medicine

## 2009-05-19 ENCOUNTER — Telehealth (INDEPENDENT_AMBULATORY_CARE_PROVIDER_SITE_OTHER): Payer: Self-pay | Admitting: Internal Medicine

## 2009-07-05 ENCOUNTER — Telehealth (INDEPENDENT_AMBULATORY_CARE_PROVIDER_SITE_OTHER): Payer: Self-pay | Admitting: Internal Medicine

## 2009-07-07 ENCOUNTER — Telehealth: Payer: Self-pay | Admitting: *Deleted

## 2009-07-07 ENCOUNTER — Ambulatory Visit: Payer: Self-pay | Admitting: Internal Medicine

## 2009-08-24 ENCOUNTER — Telehealth: Payer: Self-pay | Admitting: Internal Medicine

## 2009-09-23 ENCOUNTER — Telehealth: Payer: Self-pay | Admitting: Internal Medicine

## 2009-10-17 ENCOUNTER — Telehealth: Payer: Self-pay | Admitting: *Deleted

## 2009-10-20 ENCOUNTER — Telehealth: Payer: Self-pay | Admitting: Internal Medicine

## 2009-10-20 ENCOUNTER — Encounter: Payer: Self-pay | Admitting: Internal Medicine

## 2009-11-28 ENCOUNTER — Telehealth (INDEPENDENT_AMBULATORY_CARE_PROVIDER_SITE_OTHER): Payer: Self-pay | Admitting: *Deleted

## 2009-12-15 ENCOUNTER — Ambulatory Visit: Payer: Self-pay | Admitting: Internal Medicine

## 2010-01-03 ENCOUNTER — Ambulatory Visit: Payer: Self-pay | Admitting: Internal Medicine

## 2010-01-03 DIAGNOSIS — M545 Low back pain: Secondary | ICD-10-CM

## 2010-01-03 LAB — CONVERTED CEMR LAB
Bilirubin Urine: NEGATIVE
Leukocytes, UA: NEGATIVE
Protein, ur: NEGATIVE mg/dL
Urine Glucose: NEGATIVE mg/dL
pH: 6.5 (ref 5.0–8.0)

## 2010-01-31 ENCOUNTER — Telehealth: Payer: Self-pay | Admitting: Internal Medicine

## 2010-02-07 ENCOUNTER — Telehealth: Payer: Self-pay | Admitting: Internal Medicine

## 2010-02-23 ENCOUNTER — Encounter: Payer: Self-pay | Admitting: Internal Medicine

## 2010-02-23 ENCOUNTER — Ambulatory Visit: Admission: RE | Admit: 2010-02-23 | Discharge: 2010-02-23 | Payer: Self-pay | Source: Home / Self Care

## 2010-02-23 LAB — CONVERTED CEMR LAB
ALT: 27 units/L (ref 0–35)
Albumin: 4.7 g/dL (ref 3.5–5.2)
Alkaline Phosphatase: 254 units/L — ABNORMAL HIGH (ref 39–117)
CO2: 26 meq/L (ref 19–32)
Cholesterol: 136 mg/dL (ref 0–200)
Glucose, Bld: 118 mg/dL — ABNORMAL HIGH (ref 70–99)
LDL Cholesterol: 69 mg/dL (ref 0–99)
Potassium: 4.4 meq/L (ref 3.5–5.3)
Sodium: 140 meq/L (ref 135–145)
Total Bilirubin: 0.5 mg/dL (ref 0.3–1.2)
Total Protein: 7.4 g/dL (ref 6.0–8.3)
Triglycerides: 88 mg/dL (ref <150)
VLDL: 18 mg/dL (ref 0–40)

## 2010-02-24 ENCOUNTER — Encounter: Payer: Self-pay | Admitting: Family Medicine

## 2010-02-24 ENCOUNTER — Ambulatory Visit
Admission: RE | Admit: 2010-02-24 | Discharge: 2010-02-24 | Payer: Self-pay | Source: Home / Self Care | Attending: Family Medicine | Admitting: Family Medicine

## 2010-02-27 ENCOUNTER — Telehealth: Payer: Self-pay | Admitting: Internal Medicine

## 2010-03-05 LAB — CONVERTED CEMR LAB
ALT: 25 units/L (ref 0–35)
AST: 21 units/L (ref 0–37)
Albumin: 4.3 g/dL (ref 3.5–5.2)
Alkaline Phosphatase: 162 units/L — ABNORMAL HIGH (ref 39–117)
BUN: 18 mg/dL (ref 6–23)
Calcium: 9.5 mg/dL (ref 8.4–10.5)
Creatinine, Ser: 0.87 mg/dL (ref 0.40–1.20)
Glucose, Bld: 106 mg/dL — ABNORMAL HIGH (ref 70–99)
Potassium: 4.5 meq/L (ref 3.5–5.3)

## 2010-03-09 NOTE — Assessment & Plan Note (Signed)
Summary: FLU SHOT  Nurse Visit   Allergies: 1)  ! Codeine 2)  ! Percocet  Orders Added: 1)  Flu Vaccine 26yrs + MEDICARE PATIENTS [Q2039] 2)  Administration Flu vaccine - MCR [G0008] Flu Vaccine Consent Questions     Do you have a history of severe allergic reactions to this vaccine? no    Any prior history of allergic reactions to egg and/or gelatin? no    Do you have a sensitivity to the preservative Thimersol? no    Do you have a past history of Guillan-Barre Syndrome? no    Do you currently have an acute febrile illness? no    Have you ever had a severe reaction to latex? no    Vaccine information given and explained to patient? yes    Are you currently pregnant? no    Lot Number:[id number]   Exp Date:08/05/2010   Manufacturer: [employer] Given  rightDeltoid IM.Cynda Familia [facility])  December 15, 2009 3:22 PM .Neysa Bonito

## 2010-03-09 NOTE — Progress Notes (Signed)
Summary: change med/ hla  Phone Note From Pharmacy   Summary of Call: [facility] can no longer supply allegra, would you consider change to clarinex or xyzal? please advise and change med list Initial call taken by: Marin Roberts RN,  April 13, 2009 3:24 PM  Follow-up for Phone Call        She is already on clarinex, she can continue that and stop the allegra.  If still with symptoms on just clarinex, she can call and let me know.

## 2010-03-09 NOTE — Assessment & Plan Note (Signed)
Summary: KNEE PAIN/MJD   Vital Signs:  Patient profile:   55 year old female BP sitting:   165 / 83  Vitals Entered By: Enid Baas MD (May 09, 2009 10:59 AM)  Primary Provider:  Elby Showers MD   History of Present Illness: Pt here today for right knee pain that has been ongoing for one year, which we saw her for on 4.26.10. Pt sts pain is same as it was before the "draining and injection" we gave her at the last visit. injection last time helped for sev mos has severe DJD Meds managed at [facility]  Allergies: 1)  ! Codeine 2)  ! Percocet  Physical Exam  General:  alert, pleasant, obese Msk:  chronic DJD changes to RT knee today there is minimal effusion no bakers cyst mild warmth  MSK [place] mild swelling in suprapatellar pouch calcfied med meniscus noted  injection into pouch under vast latearalis on RT   Impression & Recommendations:  Problem # 1:  KNEE PAIN, RIGHT (ICD-719.46)  cleanse with alcohol Topical analgesic spray : Ethyl choride Joint RT knee Approached in typical fashion with: US guidance into sup pouch lat RT knee Completed without difficulty Meds:1 cc kenalog 40 and 5 ccs marcaine Needle:25 g and 1.5 inch Aftercare instructions and Red flags advised  rest for 2 days  we can reinject in 3 mos if needed  consdier standing knee films if none in past 2 years  wear good supportive shoe wear  Orders: Joint Aspirate / Injection, Large (20610)  Complete Medication List: 1)  Nasonex 50 Mcg/act Susp (Mometasone furoate) .... Once daily 2)  Multivitamins Tabs (Multiple vitamin) .... By mouth once daily 3)  Amlodipine Besylate 10 Mg Tabs (Amlodipine besylate) .... Take one tablet daily for blood pressure. 4)  Accupril 40 Mg Tabs (Quinapril hcl) .... Take 1 tablet by mouth once a day 5)  Protonix 40 Mg Pack (Pantoprazole sodium) .... Take 1 tablet by mouth once a day 6)  Combivent 103-18 Mcg/act Aero (Ipratropium-albuterol) .... Inhale 1-2 puffs  q4-6 hrs as needed 7)  Lipitor 10 Mg Tabs (Atorvastatin calcium) .... Take 1 tablet by mouth once a day 8)  Novolin N 100 Unit/ml Susp (Insulin isophane human) .... Inject 80 units in the morning and 50 in the evening. 9)  Freestyle Lite Test Strp (Glucose blood) .... Use to check blood sugar two times each day 10)  Claritin-d 12 Hour 5-120 Mg Xr12h-tab (Loratadine-pseudoephedrine) .... Take one tablet daily for allergies.

## 2010-03-09 NOTE — Miscellaneous (Signed)
Summary: Hospital Discharge Update   Hospital Discharge  Date of admission:04/11/2009  Date of discharge:04/13/2009  Brief reason for admission/active problems: 1)SOB - Likely 2/2 to worsening OSA. PE was ruled out with negative CT angio. 2D Echo revealed normal study with EF of 60-65%.   Followup needed:Reassess for shortness of breath. Schedule outpatient PFTs/Sleep study, as patient may need CPAP.   The medication and problem lists have been updated.  Please see the dictated discharge summary for details.       Complete Medication List: 1)  Nasonex 50 Mcg/act Susp (Mometasone furoate) .... Once daily 2)  Multivitamins Tabs (Multiple vitamin) .... By mouth once daily 3)  Amlodipine Besylate 10 Mg Tabs (Amlodipine besylate) .... Take one tablet daily for blood pressure. 4)  Accupril 40 Mg Tabs (Quinapril hcl) .... Take 1 tablet by mouth once a day 5)  Protonix 40 Mg Pack (Pantoprazole sodium) .... Take 1 tablet by mouth once a day 6)  Allegra 180 Mg Tabs (Fexofenadine hcl) .... Take 1 tablet by mouth once a day 7)  Combivent 103-18 Mcg/act Aero (Ipratropium-albuterol) .... Inhale 1-2 puffs q4-6 hrs as needed 8)  Lipitor 10 Mg Tabs (Atorvastatin calcium) .... Take 1 tablet by mouth once a day 9)  Novolin N 100 Unit/ml Susp (Insulin isophane human) .... Inject 60 units in the morning and 55 in the evening. 10)  Freestyle Lite Test Strp (Glucose blood) .... Use to check blood sugar two times each day 11)  Ibuprofen 600 Mg Tabs (Ibuprofen) .... Take one tablet three times a day for knee pain. 12)  Claritin-d 12 Hour 5-120 Mg Xr12h-tab (Loratadine-pseudoephedrine) .... Take one tablet daily for allergies.   Patient Instructions: 1)  Please follow up with Dr. Eben Burow at the Outpatient Clinic on April 19, 2009 at 3 pm.  2)  Please take all medications as prescribed.  3)  It is very important that you use your inhaler as directed to decrease shortness of breath.

## 2010-03-09 NOTE — Assessment & Plan Note (Signed)
Summary: EST-CK/FU/MEDS/CFB   Vital Signs:  Patient profile:   55 year old female Height:      67 inches (170.18 cm) Weight:      284.8 pounds (129.45 kg) BMI:     44.77 Temp:     98.4 degrees F (36.89 degrees C) oral Pulse rate:   92 / minute BP sitting:   140 / 80  (left arm)  Vitals Entered By: Stanton Kidney Ditzler RN (January 03, 2010 3:52 PM) CC: low back pain with standing, routine diabetes checkup Is Patient Diabetic? Yes Did you bring your meter with you today? No Pain Assessment Patient in pain? no      Nutritional Status BMI of > 30 = obese Nutritional Status Detail appetite good CBG Result 223  Have you ever been in a relationship where you felt threatened, hurt or afraid?denies   Does patient need assistance? Functional Status Self care Ambulation Normal Comments Past 6 months - low back pain after standing pd of time.   Primary Care Provider:  Whitney Post MD  CC:  low back pain with standing and routine diabetes checkup.  History of Present Illness: Ms. Chaisson is a 55 yo W with DM2, obstructive sleep apnea, HTN, fatty liver disease, and obesity who presents for routine follow-up of: 1. DM2. Did not bring meter today but reports blood sugars are usually in 100-200s. She denies any low blood sugar readings or symptoms of hypoglycemia. She admits to struggling with maintaining her diet recently and eating more sweets with the holidays approaching. She uses humulin N 50 units in the morning and 45 units in the evening. She had previously been on metformin but that was stopped due to GI upset.  2. low back pain. Over the past few months, she has noticed some lower back soreness after standing for long periods of time (at church, doing dishes, etc.). This pain is relieved typically by bending over or taking ibuprofen. She denies any acute tenderness, numbness or tingling, loss of bladder or bowel function, or weight loss. She denies dysuria, increased urinary frequency,  urinary hesitancy, or fever; however, she is worried that she has a uti that might be contributing to her back pain and she would like to be checked for that.   Depression History:      The patient denies a depressed mood most of the day and a diminished interest in her usual daily activities.         Preventive Screening-Counseling & Management  Alcohol-Tobacco     Alcohol drinks/day: 0     Smoking Status: never  Caffeine-Diet-Exercise     Does Patient Exercise: yes     Type of exercise: walking     Times/week: 3  Current Medications (verified): 1)  Nasonex 50 Mcg/act Susp (Mometasone Furoate) .... Once Daily 2)  Multivitamins Tabs (Multiple Vitamin) .... By Mouth Once Daily 3)  Amlodipine Besylate 10 Mg Tabs (Amlodipine Besylate) .... Take One Tablet Daily For Blood Pressure. 4)  Accupril 40 Mg  Tabs (Quinapril Hcl) .... Take 1 Tablet By Mouth Once A Day 5)  Protonix 40 Mg  Pack (Pantoprazole Sodium) .... Take 1 Tablet By Mouth Once A Day 6)  Combivent 103-18 Mcg/act  Aero (Ipratropium-Albuterol) .... Inhale 1-2 Puffs Q4-6 Hrs As Needed 7)  Lipitor 10 Mg Tabs (Atorvastatin Calcium) .... Take 1 Tablet By Mouth Once A Day 8)  Humulin N Pen 100 Unit/ml Susp (Insulin Isophane Human) .... Inject 50 Units in The Morning and 45 in  The Evening. 9)  Freestyle Lite Test  Strp (Glucose Blood) .... Use To Check Blood Sugar Two Times Each Day  Allergies: 1)  ! Codeine 2)  ! Percocet  Past History:  Past Medical History: Last updated: 02/09/2008 Diabetes mellitus, type II with gastroparesis Hyperlipidemia Hypertension Allergic rhinitis Fatty liver Obesity OSA that resolved post tonsilectomy 07/05. followed by Dr. 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 Eyes:  Denies blurring. CV:  Denies chest pain or discomfort. Resp:  Denies shortness of breath. GI:  Denies abdominal pain and change in bowel habits. GU:  Denies dysuria, urinary frequency, and urinary hesitancy. MS:  Complains of low back pain. Neuro:  Denies numbness and tingling. Endo:  Denies weight change.  Physical Exam  General:  vital signs reviewed. alert, cooperative to examination, and overweight-appearing.   Head:  normocephalic and atraumatic.   Eyes:  vision grossly intact, pupils equal, pupils round, and pupils reactive to light.   Mouth:  pharynx pink and moist.   Neck:  supple and no masses.   Lungs:  normal respiratory effort, normal breath sounds, no crackles, and no wheezes.   Heart:  normal rate, regular rhythm, no murmur, no gallop, and no rub.   Abdomen:  soft, non-tender, and no distention.   Msk:  normal ROM.  No tenderness to palpation of spine or paraspinal muscles.  Pulses:  R dorsalis pedis normal and L dorsalis pedis normal.   Extremities:  No cyanosis, clubbing, or edema.  Neurologic:  alert & oriented X3.  Cranial nerves grossly intact. Strength and sensation intact.  Skin:  turgor normal and no rashes.   Psych:  Oriented X3, memory intact for recent and remote, normally interactive, good eye contact, not anxious appearing, and not depressed appearing.    Diabetes Management Exam:    Foot Exam (with socks and/or shoes not present):       Sensory-Monofilament:          Left foot: normal          Right foot: normal   Impression & Recommendations:  Problem # 1:  DIABETES MELLITUS, WITH GASTROPARESIS (ICD-250.60) Assessment Deteriorated HbA1C elevated today to 8.1 (last A1C 7.3). We discussed  the possibility of increasing insulin. However, I would feel more comfortable waiting until she brings in her meter so I can be sure that she's not having low blood sugars. Also, she expresses a desire to try to improve glycemic control with better diet (she admits to eating a more high sugar diet recently). She will return to clinic in January with meter, at which time we will revisit glycemic control and possible insulin adjustment.   Her updated medication list for this problem includes:    Accupril 40 Mg Tabs (Quinapril hcl) .Marland Kitchen... Take 1 tablet by mouth once a day    Humulin N Pen 100 Unit/ml Susp (Insulin isophane human) ..... Inject 50 units in the morning and 45 in the evening.  Orders: T- Capillary Blood Glucose (82948) T-Hgb A1C (in-house) (16109UE)  Problem # 2:  HYPERLIPIDEMIA (ICD-272.4) Continue current management. She is instructed to come to next visit fasting so that lipid panel can be checked.   Her updated medication list for this problem includes:    Lipitor 10 Mg Tabs (Atorvastatin calcium) .Marland Kitchen... Take 1 tablet by mouth once a day  Problem # 3:  LOW BACK PAIN, MILD (ICD-724.2) Pain is mild and controlled with occassional ibuprofen. There are no concerning associated symptoms or exam findings. Patient advised to continue as needed ibuprofen and weight loss efforts are encouraged. Per patient request, will check UA.   Problem # 4:  SLEEP APNEA (ICD-780.57) Patient has been diagnosed with moderate obstructive sleep apnea and CPAP recommended; however, she says she is unable to tolerate CPAP because of claustrophobia and chronic sinus issues. Encouraged weight loss.   Problem # 5:  OBESITY NOS (ICD-278.00) Weight loss strategies discussed and encouraged.   Problem # 6:  HYPERTENSION (ICD-401.9) Stable. Continue current management.   Her updated medication list for this problem includes:    Amlodipine Besylate 10 Mg Tabs (Amlodipine besylate) .Marland Kitchen... Take one tablet daily for  blood pressure.    Accupril 40 Mg Tabs (Quinapril hcl) .Marland Kitchen... Take 1 tablet by mouth once a day  Complete Medication List: 1)  Nasonex 50 Mcg/act Susp (Mometasone furoate) .... Once daily 2)  Multivitamins Tabs (Multiple vitamin) .... By mouth once daily 3)  Amlodipine Besylate 10 Mg Tabs (Amlodipine besylate) .... Take one tablet daily for blood pressure. 4)  Accupril 40 Mg Tabs (Quinapril hcl) .... Take 1 tablet by mouth once a day 5)  Protonix 40 Mg Pack (Pantoprazole sodium) .... Take 1 tablet by mouth once a day 6)  Combivent 103-18 Mcg/act Aero (Ipratropium-albuterol) .... Inhale 1-2 puffs q4-6 hrs as needed 7)  Lipitor 10 Mg Tabs (Atorvastatin calcium) .... Take 1 tablet by mouth once a day 8)  Humulin N Pen 100 Unit/ml Susp (Insulin isophane human) .... Inject 50 units in the morning and 45 in the evening. 9)  Freestyle Lite Test Strp (Glucose blood) .... Use to check blood sugar two times each day  Other Orders: T-Urinalysis (16109-60454)  Patient Instructions: 1)  Please schedule a follow-up appointment in 1 month. 2)  Come in the morning without breakfast to have cholesterol checked.    Orders Added: 1)  T- Capillary Blood Glucose [82948] 2)  T-Hgb A1C (in-house) [83036QW] 3)  T-Urinalysis [81003-65000] 4)  Est. Patient Level IV [09811]   Process Orders Check Orders Results:     [facility]: ABN not required for this insurance Tests Sent for requisitioning (January 05, 2010 4:59 AM):     01/03/2010: [facility] -- T-Urinalysis [81003-65000] (signed)     Prevention & Chronic Care Immunizations   Influenza vaccine: Fluvax 3+  (12/15/2009)   Influenza vaccine due: 11/05/2009    Tetanus booster: Not documented   Td booster deferral: Deferred  (04/20/2009)    Pneumococcal vaccine: Not documented   Pneumococcal vaccine deferral: Deferred  (04/20/2009)  Colorectal Screening   Hemoccult: Not documented    Colonoscopy:  Diverticulosis in descending colon.  (01/23/2005)   Colonoscopy due: 01/24/2015  Other Screening   Pap smear: NEGATIVE FOR INTRAEPITHELIAL LESIONS OR MALIGNANCY.  (04/20/2009)   Pap smear action/deferral: Deferred  (04/20/2009)   Pap smear due: 09/2008    Mammogram: ASSESSMENT: Negative - BI-RADS 1^MM DIGITAL SCREENING  (04/27/2009)   Mammogram action/deferral: Ordered  (04/20/2009)   Mammogram due: 01/2010   Smoking status: never  (01/03/2010)  Diabetes  Mellitus   HgbA1C: 8.1  (01/03/2010)   HgbA1C action/deferral: Ordered  (12/01/2008)    Eye exam: No diabetic retinopathy.     (02/04/2008)   Diabetic eye exam action/deferral: Ophthalmology referral  (07/07/2009)   Eye exam due: 02/2009    Foot exam: yes  (01/03/2010)   High risk foot: Yes  (01/03/2010)   Foot care education: Done  (01/03/2010)    Urine microalbumin/creatinine ratio: 12.2  (04/11/2009)    Diabetes flowsheet reviewed?: Yes   Progress toward A1C goal: Deteriorated  Lipids   Total Cholesterol: 135  (05/21/2008)   LDL: 65  (05/21/2008)   LDL Direct: Not documented   HDL: 47  (05/21/2008)   Triglycerides: 116  (05/21/2008)    SGOT (AST): 24  (04/11/2009)   SGPT (ALT): 26  (04/11/2009)   Alkaline phosphatase: 208  (04/11/2009)   Total bilirubin: 0.5  (04/11/2009)    Lipid flowsheet reviewed?: Yes   Progress toward LDL goal: Unchanged  Hypertension   Last Blood Pressure: 140 / 80  (01/03/2010)   Serum creatinine: 1.28  (04/11/2009)   Serum potassium 4.0  (04/11/2009)    Hypertension flowsheet reviewed?: Yes   Progress toward BP goal: Unchanged  Self-Management Support :   Personal Goals (by the next clinic visit) :     Personal A1C goal: 7  (12/01/2008)     Personal blood pressure goal: 130/80  (12/01/2008)     Personal LDL goal: 100  (12/01/2008)    Patient will work on the following items until the next clinic visit to reach self-care goals:     Medications and monitoring: take my medicines  every day, check my blood sugar, bring all of my medications to every visit, examine my feet every day  (01/03/2010)     Eating: drink diet soda or water instead of juice or soda, eat more vegetables, use fresh or frozen vegetables, eat foods that are low in salt, eat fruit for snacks and desserts, limit or avoid alcohol  (01/03/2010)     Activity: take a 30 minute walk every day  (01/03/2010)    Diabetes self-management support: Written self-care plan, Education handout, Resources for patients handout  (01/03/2010)   Diabetes care plan printed   Diabetes education handout printed   Last diabetes self-management training by diabetes educator: 07/09/2008    Hypertension self-management support: Written self-care plan, Education handout, Resources for patients handout  (01/03/2010)   Hypertension self-care plan printed.   Hypertension education handout printed    Lipid self-management support: Written self-care plan, Education handout, Resources for patients handout  (01/03/2010)   Lipid self-care plan printed.   Lipid education handout printed      Resource handout printed.   Last LDL:                                                 65 (05/21/2008 7:23:00 PM)        Diabetic Foot Exam Foot Inspection Is there a history of a foot ulcer?              No Is there a foot ulcer now?              No Can the patient see the bottom of their feet?          Yes Are the shoes appropriate in style and fit?  Yes Is there swelling or an abnormal foot shape?          No Are the toenails long?                No Are the toenails thick?                No Are the toenails ingrown?              No Is there heavy callous build-up?              Yes Is there a claw toe deformity?                          No Is there elevated skin temperature?            No Is there limited ankle dorsiflexion?            No Is there foot or ankle muscle weakness?            No Do you have pain in calf while  walking?           No      Diabetic Foot Care Education :Patient educated on appropriate care of diabetic feet.  Pulse Check          Right Foot          Left Foot Posterior Tibial:        1+            1+ Dorsalis Pedis:        1+            1+  High Risk Feet? Yes   10-g (5.07) Semmes-Weinstein Monofilament Test Performed by: Stanton Kidney Ditzler RN          Right Foot          Left Foot Visual Inspection     normal         normal Test Control      normal         normal Site 1         normal         normal Site 2         normal         normal Site 3         normal         normal Site 4         normal         normal Site 5         normal         normal Site 6         normal         normal Site 7         normal         normal Site 8         normal         normal Site 9         normal         normal Site 10         normal         normal  Impression      normal         normal  Laboratory Results   Blood Tests   Date/Time Received: January 03, 2010 4:06 PM Date/Time Reported: Alric Quan  January 03, 2010  4:06 PM   HGBA1C: 8.1%   (Normal Range: Non-Diabetic - 3-6%   Control Diabetic - 6-8%) CBG Random:: 223mg /dL      Appended Document: EST-CK/FU/MEDS/CFB I discussed the patient with Dr. Odis Luster and I agree with the assessment and plan as outlined above.

## 2010-03-09 NOTE — Progress Notes (Addendum)
Summary: Refill/gh  Phone Note Refill Request Message from:  Fax from Pharmacy on February 27, 2010 12:12 PM  Refills Requested: Medication #1:  HUMULIN N PEN 100 UNIT/ML SUSP Inject 50 units in the morning and 45 in the evening.   Last Refilled: 11/30/2009 Last visit and labs were 02/23/2010   Method Requested: Fax to Local Pharmacy Initial call taken by: Angelina Ok RN,  February 27, 2010 12:12 PM    Prescriptions: FREESTYLE LITE TEST  STRP (GLUCOSE BLOOD) Use to check blood sugar two times each day  #50 x 6   Entered by:   Donia Guiles MD   Authorized by:   Whitney Post MD   Signed by:   Donia Guiles MD on 02/27/2010   Method used:   Electronically to        [facility] [id number] (retail)       [address] [place], [place]  [address]       Ph: [phone]       Fax: [phone]   RxID:   [id number] HUMULIN N PEN 100 UNIT/ML SUSP (INSULIN ISOPHANE HUMAN) Inject 50 units in the morning and 45 in the evening.  #30 cc x 6   Entered by:   Donia Guiles MD   Authorized by:   Whitney Post MD   Signed by:   Donia Guiles MD on 02/27/2010   Method used:   Electronically to        [facility] [id number] (retail)       [address] [place], [place]  [address]       Ph: [phone]       Fax: [phone]   RxID:   [id number]

## 2010-03-09 NOTE — Assessment & Plan Note (Signed)
Summary: EST-2 MONTH F/U VISIT/CH   Vital Signs:  Patient profile:   55 year old female Height:      67 inches (170.18 cm) Weight:      279.3 pounds (126.95 kg) BMI:     43.90 Temp:     97.1 degrees F (36.17 degrees C) oral Pulse rate:   90 / minute BP sitting:   136 / 80  (left arm)  Vitals Entered By: Stanton Kidney Ditzler RN (February 23, 2010 8:52 AM) Is Patient Diabetic? Yes Did you bring your meter with you today? Yes Pain Assessment Patient in pain? yes     Location: right knee Intensity: ? Type: dull Onset of pain  long time Nutritional Status BMI of > 30 = obese Nutritional Status Detail appetite good  Have you ever been in a relationship where you felt threatened, hurt or afraid?denies   Does patient need assistance? Functional Status Self care Ambulation Normal Comments ? labs and renewal handicapped sticker.   Primary Care Provider:  Whitney Post MD   History of Present Illness: 55yo W presents for evaluation of: 1. insomnia. Pt reports extreme difficulty falling asleep and staying asleep despite good sleep hygeine (keeps a consistent bedtime, bedroom dark and quiet at night). She takes Tylenol PM sometimes to sleep but she is frustrated at this ongoing difficulty with sleeping.  2. allergic rhinitis. Complains of ongoing clear nasal drainage. Has a long history of allergies and sinus problems but is not currently taking anything other than flonase. Would like a prescription for allegra.  3. DM2. Has been checking CBGs regularly at home. Most readings are in the 100s, although she has had a few higher readings (200s and one reading in the 300s) in the evening and a few somewhat low readings (70s-80s)  in the mornings. She denies symptoms of hypoglycemia.   Depression History:      The patient denies a depressed mood most of the day and a diminished interest in her usual daily activities.         Preventive Screening-Counseling & Management  Alcohol-Tobacco  Alcohol drinks/day: 0     Smoking Status: never  Caffeine-Diet-Exercise     Does Patient Exercise: yes     Type of exercise: walking     Times/week: 3  Current Medications (verified): 1)  Nasonex 50 Mcg/act Susp (Mometasone Furoate) .... Once Daily 2)  Amlodipine Besylate 10 Mg Tabs (Amlodipine Besylate) .... Take One Tablet Daily For Blood Pressure. 3)  Accupril 40 Mg  Tabs (Quinapril Hcl) .... Take 1 Tablet By Mouth Once A Day 4)  Protonix 40 Mg  Pack (Pantoprazole Sodium) .... Take 1 Tablet By Mouth Once A Day 5)  Combivent 103-18 Mcg/act  Aero (Ipratropium-Albuterol) .... Inhale 1-2 Puffs Q4-6 Hrs As Needed 6)  Lipitor 10 Mg Tabs (Atorvastatin Calcium) .... Take 1 Tablet By Mouth Once A Day 7)  Humulin N Pen 100 Unit/ml Susp (Insulin Isophane Human) .... Inject 50 Units in The Morning and 45 in The Evening. 8)  Freestyle Lite Test  Strp (Glucose Blood) .... Use To Check Blood Sugar Two Times Each Day 9)  Zolpidem Tartrate 6.25 Mg Cr-Tabs (Zolpidem Tartrate) .... Take 1 Tablet By Mouth Once A Day At Bedtime 10)  Allergy Relief 180 Mg Tabs (Fexofenadine Hcl) .... Take 1 Tablet By Mouth Once A Day  Allergies: 1)  ! Codeine 2)  ! Percocet  Past History:  Past Medical History: Last updated: 02/09/2008 Diabetes mellitus, type II with gastroparesis Hyperlipidemia  Hypertension Allergic rhinitis Fatty liver Obesity OSA that resolved post tonsilectomy 07/05. followed by Dr. 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 ENT:  Complains of nasal congestion and sinus pressure; denies difficulty swallowing and sore throat. CV:  Denies chest pain or discomfort. Resp:  Denies cough and shortness of breath. GI:  Denies abdominal pain and change in bowel habits. MS:  Complains of joint pain; long hx of R knee pain. Neuro:  Denies numbness and tingling. Allergy:  Complains of seasonal allergies.  Physical Exam  General:  alert and cooperative to examination.   Head:  normocephalic and atraumatic.   Nose:  no external deformity and mucosal edema.   Mouth:  pharynx pink and moist.   Neck:  supple and no masses.   Lungs:  normal breath sounds, no crackles, and no wheezes.   Heart:  normal rate, regular rhythm, no murmur, no gallop, and no rub.   Abdomen:  soft and non-tender.   Extremities:  No edema.  Neurologic:  alert & oriented X3, cranial nerves grossly intact, strength normal in all extremities, and sensation intact to light touch.   Skin:  turgor normal and no rashes.   Psych:  Oriented X3, memory intact for recent and remote, normally interactive, good eye contact, not anxious appearing, and not depressed appearing.    Diabetes Management Exam:    Foot Exam (with socks and/or shoes not present):       Sensory-Monofilament:          Left foot: normal          Right foot: normal   Impression & Recommendations:  Problem # 1:  INSOMNIA (ICD-780.52) Will prescribe trial of Ambien. Patient advised of risks of using sleep aides. Also reinforced good sleep hygeine.   Her updated medication list for this problem includes:    Zolpidem Tartrate 6.25 Mg Cr-tabs (Zolpidem tartrate) .Marland Kitchen... Take 1 tablet by mouth once a day at bedtime  Problem # 2:  DIABETES MELLITUS, WITH GASTROPARESIS (ICD-250.60) CBGs demonstrate mostly good control (72% of CBGs within goal range according to home meter). She has  some high readings in evenings prior to evening injection but I am reluctant to increase insulin given few somewhat low readings on meter. Continue current management and monitor on follow-up.   Her updated medication list for this problem includes:    Accupril 40 Mg Tabs (Quinapril hcl) .Marland Kitchen... Take 1 tablet by mouth once a day    Humulin N Pen 100 Unit/ml Susp (Insulin isophane human) ..... Inject 50 units in the morning and 45 in the evening.  Orders: T-Lipid Profile [id number]) T-Comprehensive Metabolic Panel [phone])  Problem # 3:  ALLERGIC RHINITIS (ICD-477.9) Long hx of allergic rhinitis. Will prescribe fexofenadine, which has helped her in the past.   Her updated medication list for this problem includes:    Nasonex 50 Mcg/act Susp (Mometasone furoate) ..... Once daily    Allergy Relief 180 Mg Tabs (Fexofenadine hcl) .Marland KitchenMarland KitchenMarland KitchenMarland Kitchen  Take 1 tablet by mouth once a day  Problem # 4:  HYPERLIPIDEMIA (ICD-272.4) Will check lipids and CMET today.   Her updated medication list for this problem includes:    Lipitor 10 Mg Tabs (Atorvastatin calcium) .Marland Kitchen... Take 1 tablet by mouth once a day  Orders: T-Lipid Profile ([id number]) T-Comprehensive Metabolic Panel ([id number])  Complete Medication List: 1)  Nasonex 50 Mcg/act Susp (Mometasone furoate) .... Once daily 2)  Amlodipine Besylate 10 Mg Tabs (Amlodipine besylate) .... Take one tablet daily for blood pressure. 3)  Accupril 40 Mg Tabs (Quinapril hcl) .... Take 1 tablet by mouth once a day 4)  Protonix 40 Mg Pack (Pantoprazole sodium) .... Take 1 tablet by mouth once a day 5)  Combivent 103-18 Mcg/act Aero (Ipratropium-albuterol) .... Inhale 1-2 puffs q4-6 hrs as needed 6)  Lipitor 10 Mg Tabs (Atorvastatin calcium) .... Take 1 tablet by mouth once a day 7)  Humulin N Pen 100 Unit/ml Susp (Insulin isophane human) .... Inject 50 units in the morning and 45 in the evening. 8)  Freestyle Lite Test Strp (Glucose blood) .... Use to check  blood sugar two times each day 9)  Zolpidem Tartrate 6.25 Mg Cr-tabs (Zolpidem tartrate) .... Take 1 tablet by mouth once a day at bedtime 10)  Allergy Relief 180 Mg Tabs (Fexofenadine hcl) .... Take 1 tablet by mouth once a day  Patient Instructions: 1)  Please schedule a follow-up appointment in 3 months. Prescriptions: ZOLPIDEM TARTRATE 6.25 MG CR-TABS (ZOLPIDEM TARTRATE) Take 1 tablet by mouth once a day at bedtime  #30 x 0   Entered and Authorized by:   Whitney Post MD   Signed by:   Whitney Post MD on 02/23/2010   Method used:   Print then Give to Patient   RxID:   [id number] ALLERGY RELIEF 180 MG TABS (FEXOFENADINE HCL) Take 1 tablet by mouth once a day  #30 x 6   Entered and Authorized by:   Whitney Post MD   Signed by:   Whitney Post MD on 02/23/2010   Method used:   Faxed to ...       [facility] (retail)       [address]       [place], [place]  [address]       Ph: [phone]       Fax: [phone]   RxID:   [id number]    Orders Added: 1)  T-Lipid Profile [id number] 2)  T-Comprehensive Metabolic Panel [[id number]] 3)  Est. Patient Level IV [09323]   Process Orders Check Orders Results:     [facility]: ABN not required for this insurance Tests Sent for requisitioning (February 27, 2010 7:30 AM):     02/23/2010: [facility] -- T-Lipid Profile [phone] (signed)     02/23/2010: [facility] -- T-Comprehensive Metabolic Panel [id number] (signed)     Process Orders Check Orders Results:     [facility]: ABN not required for this insurance Tests Sent for requisitioning (February 27, 2010 7:30 AM):     02/23/2010: [facility] -- T-Lipid Profile [id number] (signed)     02/23/2010: [facility] -- T-Comprehensive Metabolic Panel [phone] (signed)    Prevention & Chronic Care Immunizations   Influenza  vaccine: Fluvax 3+  (12/15/2009)   Influenza vaccine due: 11/05/2009    Tetanus booster: Not documented   Td booster deferral: Deferred  (04/20/2009)    Pneumococcal vaccine: Not documented   Pneumococcal  vaccine deferral: Deferred  (04/20/2009)  Colorectal Screening   Hemoccult: Not documented    Colonoscopy: Diverticulosis in descending colon.  (01/23/2005)   Colonoscopy due: 01/24/2015  Other Screening   Pap smear: NEGATIVE FOR INTRAEPITHELIAL LESIONS OR MALIGNANCY.  (04/20/2009)   Pap smear action/deferral: Deferred  (04/20/2009)   Pap smear due: 09/2008    Mammogram: ASSESSMENT: Negative - BI-RADS 1^MM DIGITAL SCREENING  (04/27/2009)   Mammogram action/deferral: Ordered  (04/20/2009)   Mammogram due: 01/2010   Smoking status: never  (02/23/2010)  Diabetes Mellitus   HgbA1C: 8.1  (01/03/2010)   HgbA1C action/deferral: Ordered  (12/01/2008)    Eye exam: Mild non-proliferative diabetic retinopathy.  ou   (10/20/2009)   Diabetic eye exam action/deferral: Ophthalmology referral  (07/07/2009)   Eye exam due: 10/2010    Foot exam: yes  (02/23/2010)   High risk foot: Yes  (02/23/2010)   Foot care education: Done  (01/03/2010)    Urine microalbumin/creatinine ratio: 12.2  (04/11/2009)    Diabetes flowsheet reviewed?: Yes   Progress toward A1C goal: Unchanged  Lipids   Total Cholesterol: 135  (05/21/2008)   LDL: 65  (05/21/2008)   LDL Direct: Not documented   HDL: 47  (05/21/2008)   Triglycerides: 116  (05/21/2008)    SGOT (AST): 24  (04/11/2009)   SGPT (ALT): 26  (04/11/2009) CMP ordered    Alkaline phosphatase: 208  (04/11/2009)   Total bilirubin: 0.5  (04/11/2009)    Lipid flowsheet reviewed?: Yes   Progress toward LDL goal: Unchanged  Hypertension   Last Blood Pressure: 136 / 80  (02/23/2010)   Serum creatinine: 1.28  (04/11/2009)   Serum potassium 4.0  (04/11/2009) CMP ordered     Hypertension flowsheet reviewed?: Yes   Progress toward BP goal:  Unchanged  Self-Management Support :   Personal Goals (by the next clinic visit) :     Personal A1C goal: 7  (12/01/2008)     Personal blood pressure goal: 130/80  (12/01/2008)     Personal LDL goal: 100  (12/01/2008)    Patient will work on the following items until the next clinic visit to reach self-care goals:     Medications and monitoring: take my medicines every day, check my blood sugar, bring all of my medications to every visit, weigh myself weekly, examine my feet every day  (02/23/2010)     Eating: drink diet soda or water instead of juice or soda, eat more vegetables, use fresh or frozen vegetables, eat foods that are low in salt, eat fruit for snacks and desserts, limit or avoid alcohol  (02/23/2010)     Activity: take a 30 minute walk every day  (01/03/2010)    Diabetes self-management support: Copy of home glucose meter record, Written self-care plan, Education handout, Resources for patients handout  (02/23/2010)   Diabetes care plan printed   Diabetes education handout printed   Last diabetes self-management training by diabetes educator: 07/09/2008    Hypertension self-management support: Written self-care plan, Education handout, Resources for patients handout  (02/23/2010)   Hypertension self-care plan printed.   Hypertension education handout printed    Lipid self-management support: Written self-care plan, Education handout, Resources for patients handout  (02/23/2010)   Lipid self-care plan printed.   Lipid education handout printed      Resource handout printed.   Last LDL:  65 (05/21/2008 7:23:00 PM)        Diabetic Foot Exam Foot Inspection Is there a history of a foot ulcer?              No Is there a foot ulcer now?              No Can the patient see the bottom of their feet?          Yes Are the shoes appropriate in style and fit?          Yes Is there swelling or an abnormal foot shape?           No Are the toenails long?                No Are the toenails thick?                No Are the toenails ingrown?              No Is there heavy callous build-up?              No Is there a claw toe deformity?                          No Is there elevated skin temperature?            No Is there limited ankle dorsiflexion?            No Is there foot or ankle muscle weakness?            No Do you have pain in calf while walking?           No      Pulse Check          Right Foot          Left Foot Posterior Tibial:        2+            2+ Dorsalis Pedis:        2+            2+  High Risk Feet? Yes   10-g (5.07) Semmes-Weinstein Monofilament Test Performed by: Stanton Kidney Ditzler RN          Right Foot          Left Foot Visual Inspection     normal         normal Test Control      normal         normal Site 1         normal         normal Site 2         normal         normal Site 3         normal         normal Site 4         normal         normal Site 5         normal         normal Site 6         normal         normal Site 7         normal         normal Site 8         normal         normal  Site 9         normal         normal Site 10         normal         normal  Impression      normal         normal

## 2010-03-09 NOTE — Consult Note (Signed)
Summary: Regina Stevenson  Regina Stevenson   Imported By: Louretta Parma 12/07/2009 15:25:19  _____________________________________________________________________  External Attachment:    Type:   Image     Comment:   External Document  Appended Document: [name]   Diabetic Eye Exam  Procedure date:  10/20/2009  Findings:      Mild non-proliferative diabetic retinopathy.  ou   Procedures Next Due Date:    Diabetic Eye Exam: 10/2010   Diabetic Eye Exam  Procedure date:  10/20/2009  Findings:      Mild non-proliferative diabetic retinopathy.  ou   Procedures Next Due Date:    Diabetic Eye Exam: 10/2010

## 2010-03-09 NOTE — Progress Notes (Signed)
Summary: refill/gg  Phone Note Refill Request  on November 28, 2009 4:48 PM  Refills Requested: Medication #1:  HUMULIN N PEN 100 UNIT/ML SUSP Inject 50 units in the morning and 40 in the evening.  Method Requested: Fax to Local Pharmacy Initial call taken by: Merrie Roof RN,  November 28, 2009 4:48 PM  Follow-up for Phone Call        Will provide 1 month supply.  Needs to be seen as I see she has canceled 11/7 appt.   Follow-up by: Mariea Stable MD,  November 28, 2009 4:52 PM    Prescriptions: HUMULIN N PEN 100 UNIT/ML SUSP (INSULIN ISOPHANE HUMAN) Inject 50 units in the morning and 40 in the evening.  #5 x 3   Entered by:   Mariea Stable MD   Authorized by:   Marland Kitchen [facility] ATTENDING DESKTOP   Signed by:   Mariea Stable MD on 11/28/2009   Method used:   Faxed to ...       [facility] (retail)       [address]       [place], [place]  [address]       Ph: [phone]       Fax: [phone]   RxID:   [id number]

## 2010-03-09 NOTE — Progress Notes (Signed)
Summary: med refill/gp  Phone Note Refill Request Message from:  Fax from Pharmacy on November 28, 2009 11:18 AM  Refills Requested: Medication #1:  PROTONIX 40 MG  PACK Take 1 tablet by mouth once a day   Last Refilled: 09/16/2009 Last appt. 07/07/09; next apt.11/7.   Method Requested: Telephone to Pharmacy Initial call taken by: Chinita Pester RN,  November 28, 2009 11:19 AM  Follow-up for Phone Call        Rx faxed to pharmacy Follow-up by: Mariea Stable MD,  November 28, 2009 12:09 PM    Prescriptions: PROTONIX 40 MG  PACK (PANTOPRAZOLE SODIUM) Take 1 tablet by mouth once a day  #31 x 1   Entered by:   Mariea Stable MD   Authorized by:   Marland Kitchen [facility] ATTENDING DESKTOP   Signed by:   Mariea Stable MD on 11/28/2009   Method used:   Faxed to ...       [facility] (retail)       [address]       [place], [place]  [address]       Ph: [phone]       Fax: [phone]   RxID:   [id number]

## 2010-03-09 NOTE — Assessment & Plan Note (Signed)
Summary: f/u diabetes, clarify insulin dose, needs meds/pcp-[facility]/hla   Vital Signs:  Patient profile:   55 year old female Height:      67 inches Weight:      280.1 pounds BMI:     44.03 Temp:     97.4 degrees F oral Pulse rate:   86 / minute BP sitting:   154 / 73  (right arm)  Vitals Entered By: Filomena Jungling NT II (July 07, 2009 1:45 PM) CC: ? insulin, doseage Is Patient Diabetic? Yes Did you bring your meter with you today? No Pain Assessment Patient in pain? no      Nutritional Status BMI of > 30 = obese CBG Result 237  Have you ever been in a relationship where you felt threatened, hurt or afraid?No   Does patient need assistance? Functional Status Self care Ambulation Normal   Primary Care Provider:  Elby Showers MD  CC:  ? insulin and doseage.  History of Present Illness: Has not gone back for remainder of sleep study to titrate machine secondary to claustrophobia. Feels like breathing has improved. Asked about results of PFTs which showed a restrictive pattern. Hospitalized recently for shortness of breath in March.   No other complaints - other than not having as much weight loss and exercising as much as she should - not able to do as much as she has wanted to.  Blood sugars have been 120, 109, 118 per patient. Did not bring glucometer with her to the appointment.  Preventive Screening-Counseling & Management  Alcohol-Tobacco     Alcohol drinks/day: 0     Smoking Status: never  Caffeine-Diet-Exercise     Does Patient Exercise: yes     Type of exercise: walking     Times/week: 3  Current Medications (verified): 1)  Nasonex 50 Mcg/act Susp (Mometasone Furoate) .... Once Daily 2)  Multivitamins Tabs (Multiple Vitamin) .... By Mouth Once Daily 3)  Amlodipine Besylate 10 Mg Tabs (Amlodipine Besylate) .... Take One Tablet Daily For Blood Pressure. 4)  Accupril 40 Mg  Tabs (Quinapril Hcl) .... Take 1 Tablet By Mouth Once A Day 5)  Protonix 40 Mg  Pack  (Pantoprazole Sodium) .... Take 1 Tablet By Mouth Once A Day 6)  Combivent 103-18 Mcg/act  Aero (Ipratropium-Albuterol) .... Inhale 1-2 Puffs Q4-6 Hrs As Needed 7)  Lipitor 10 Mg Tabs (Atorvastatin Calcium) .... Take 1 Tablet By Mouth Once A Day 8)  Novolin N 100 Unit/ml Susp (Insulin Isophane Human) .... Inject 50 Units in The Morning and 40 in The Evening. 9)  Freestyle Lite Test  Strp (Glucose Blood) .... Use To Check Blood Sugar Two Times Each Day 10)  Claritin-D 12 Hour 5-120 Mg Xr12h-Tab (Loratadine-Pseudoephedrine) .... Take One Tablet Daily For Allergies.  Allergies (verified): 1)  ! Codeine 2)  ! Percocet  Past History:  Past Medical History: Last updated: 02/09/2008 Diabetes mellitus, type II with gastroparesis Hyperlipidemia Hypertension Allergic rhinitis Fatty liver Obesity OSA that resolved post tonsilectomy 07/05. followed by Dr. 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  Head:  normocephalic and atraumatic.  Eyes:  vision grossly intact, pupils equal, and pupils round.     Ears:  no external deformities.  no external deformities.   Nose:  no external deformity, no external erythema, and no nasal discharge.  Mouth:  pharynx pink and moist.   Lungs:  normal respiratory effort, no accessory muscle use, normal breath sounds, no crackles, and no wheezes.   Heart:  normal rate, regular rhythm, no murmur, no gallop, and no rub.   Abdomen:  soft, non-tender, no guarding, and no rigidity.  Pulses:  R dorsalis pedis normal and L dorsalis pedis normal.  Extremities:  Sensation to light touch intact in feet bilaterally. Neurologic:  alert & oriented X3, cranial nerves  II-XII intact, strength normal in all extremities, sensation intact to light touch, and gait normal.  Skin:  turgor normal, color normal, and no rashes. Psych:  Oriented X3, memory intact for recent and remote, normally interactive, good eye contact, not anxious appearing, and not depressed appearing.    Impression & Recommendations:  Problem # 1:  DIABETES MELLITUS, WITH GASTROPARESIS (ICD-250.60)  Patient came for refill as she was told to come in before getting a refill of her insulin. She states that she uses the Humulin pen and needs a ophtho referral. Will give script - we have her insulin as novolin - has been this way for >1 year and she says that she has always received the Humulin pen. Will keep script as novolin for now and can call in Humulin pen if needed. A1c improved. See back in 3 months for A1c check given recent changes in insulin requirement.  Her updated medication list for this problem includes:    Accupril 40 Mg Tabs (Quinapril hcl) .Marland Kitchen... Take 1 tablet by mouth once a day    Novolin N 100 Unit/ml Susp (Insulin isophane human) ..... Inject 50 units in the morning and 40 in the evening.  Orders: T- Capillary Blood Glucose ([id number]) T-Hgb A1C (in-house) (04540JW) Ophthalmology Referral (Ophthalmology)  Her updated medication list for this problem includes:    Accupril 40 Mg Tabs (Quinapril hcl) .Marland Kitchen... Take 1 tablet by mouth once a day    Novolin N 100 Unit/ml Susp (Insulin isophane human) ..... Inject 50 units in the morning and 40 in the evening.  Problem # 2:  DYSPNEA ON EXERTION (ICD-786.09)  Improved. PFTs show restrictive pattern, likely related to obesity, discussed the findings of the PFTs with the patient. Has not been back for sleep study to finish titration of CPAP. Encouraged patient to return as she has dx of OSA.  Her updated medication list for this problem includes:    Accupril 40 Mg Tabs (Quinapril hcl) .Marland Kitchen... Take 1 tablet by mouth once a day     Combivent 103-18 Mcg/act Aero (Ipratropium-albuterol) ..... Inhale 1-2 puffs q4-6 hrs as needed  Her updated medication list for this problem includes:    Accupril 40 Mg Tabs (Quinapril hcl) .Marland Kitchen... Take 1 tablet by mouth once a day    Combivent 103-18 Mcg/act Aero (Ipratropium-albuterol) ..... Inhale 1-2 puffs q4-6 hrs as needed  Problem # 3:  HYPERTENSION (ICD-401.9)  Patient had bottle of amlodipine 5 mg once daily in her purse - told her that she should be on 10 mg. Will give new prescription - unsure how this was given as 5 mg once daily from her pharmacy as our records show she has been on 10 mg.  Her updated medication list for this problem includes:    Amlodipine Besylate 10 Mg Tabs (Amlodipine besylate) .Marland Kitchen... Take one tablet daily for blood pressure.    Accupril 40 Mg Tabs (Quinapril  hcl) ..... Take 1 tablet by mouth once a day  BP today: 154/73 Prior BP: 165/83 (05/09/2009)  Labs Reviewed: K+: 4.0 (04/11/2009) Creat: : 1.28 (04/11/2009)   Chol: 135 (05/21/2008)   HDL: 47 (05/21/2008)   LDL: 65 (05/21/2008)   TG: 116 (05/21/2008)  Her updated medication list for this problem includes:    Amlodipine Besylate 10 Mg Tabs (Amlodipine besylate) .Marland Kitchen... Take one tablet daily for blood pressure.    Accupril 40 Mg Tabs (Quinapril hcl) .Marland Kitchen... Take 1 tablet by mouth once a day  Problem # 4:  Preventive Health Care (ICD-V70.0) UTD with colonoscopy, mammogram and PAP.  Problem # 5:  HYPERLIPIDEMIA (ICD-272.4)  Continue Lipitor.  Her updated medication list for this problem includes:    Lipitor 10 Mg Tabs (Atorvastatin calcium) .Marland Kitchen... Take 1 tablet by mouth once a day  Labs Reviewed: SGOT: 24 (04/11/2009)   SGPT: 26 (04/11/2009)   HDL:47 (05/21/2008), 55 (10/03/2007)  LDL:65 (05/21/2008), 110 (16/11/9602)  Chol:135 (05/21/2008), 185 (10/03/2007)  Trig:116 (05/21/2008), 99 (10/03/2007)  Her updated medication list for this problem includes:    Lipitor 10 Mg Tabs (Atorvastatin calcium)  .Marland Kitchen... Take 1 tablet by mouth once a day  Problem # 6:  SLEEP APNEA (ICD-780.57) Discussed with patient about returning to sleep center for evaluation to complete titration of CPAP.  Complete Medication List: 1)  Nasonex 50 Mcg/act Susp (Mometasone furoate) .... Once daily 2)  Multivitamins Tabs (Multiple vitamin) .... By mouth once daily 3)  Amlodipine Besylate 10 Mg Tabs (Amlodipine besylate) .... Take one tablet daily for blood pressure. 4)  Accupril 40 Mg Tabs (Quinapril hcl) .... Take 1 tablet by mouth once a day 5)  Protonix 40 Mg Pack (Pantoprazole sodium) .... Take 1 tablet by mouth once a day 6)  Combivent 103-18 Mcg/act Aero (Ipratropium-albuterol) .... Inhale 1-2 puffs q4-6 hrs as needed 7)  Lipitor 10 Mg Tabs (Atorvastatin calcium) .... Take 1 tablet by mouth once a day 8)  Novolin N 100 Unit/ml Susp (Insulin isophane human) .... Inject 50 units in the morning and 40 in the evening. 9)  Freestyle Lite Test Strp (Glucose blood) .... Use to check blood sugar two times each day 10)  Claritin-d 12 Hour 5-120 Mg Xr12h-tab (Loratadine-pseudoephedrine) .... Take one tablet daily for allergies.  Patient Instructions: 1)  Please schedule a follow-up appointment in 3 months. 2)  It is important that you exercise regularly at least 20 minutes 5 times a week. If you develop chest pain, have severe difficulty breathing, or feel very tired , stop exercising immediately and seek medical attention. 3)  Check your blood sugars regularly. If your readings are usually above 300 or below 70 you should contact our office. 4)  It is important that your Diabetic A1c level is checked every 3 months. 5)  See your eye doctor yearly to check for diabetic eye damage. 6)  Check your feet each night for sore areas, calluses or signs of infection. 7)  Check your Blood Pressure regularly. If it is above: you should make an appointment. Prescriptions: AMLODIPINE BESYLATE 10 MG TABS (AMLODIPINE BESYLATE) Take one  tablet daily for blood pressure.  #32 x 6   Entered and Authorized by:   Nilda Riggs MD   Signed by:   Nilda Riggs MD on 07/07/2009   Method used:   Print then Give to Patient   RxID:   [id number] ACCUPRIL 40 MG  TABS (QUINAPRIL HCL) Take 1 tablet by mouth once a day  #  31 x 6   Entered and Authorized by:   Nilda Riggs MD   Signed by:   Nilda Riggs MD on 07/07/2009   Method used:   Print then Give to Patient   RxID:   [id number] CLARITIN-D 12 HOUR 5-120 MG XR12H-TAB (LORATADINE-PSEUDOEPHEDRINE) Take one tablet daily for allergies.  #32 x 6   Entered and Authorized by:   Nilda Riggs MD   Signed by:   Nilda Riggs MD on 07/07/2009   Method used:   Print then Give to Patient   RxID:   [id number] NOVOLIN N 100 UNIT/ML SUSP (INSULIN ISOPHANE HUMAN) Inject 50 units in the morning and 40 in the evening.  #5 vials x 3   Entered and Authorized by:   Nilda Riggs MD   Signed by:   Nilda Riggs MD on 07/07/2009   Method used:   Print then Give to Patient   RxID:   [id number]    Prevention & Chronic Care Immunizations   Influenza vaccine: Fluvax 3+  (10/29/2007)   Influenza vaccine due: 11/05/2009    Tetanus booster: Not documented   Td booster deferral: Deferred  (04/20/2009)    Pneumococcal vaccine: Not documented   Pneumococcal vaccine deferral: Deferred  (04/20/2009)  Colorectal Screening   Hemoccult: Not documented    Colonoscopy: Diverticulosis in descending colon.  (01/23/2005)   Colonoscopy due: 01/24/2015  Other Screening   Pap smear: NEGATIVE FOR INTRAEPITHELIAL LESIONS OR MALIGNANCY.  (04/20/2009)   Pap smear action/deferral: Deferred  (04/20/2009)   Pap smear due: 09/2008    Mammogram: ASSESSMENT: Negative - BI-RADS 1^MM DIGITAL SCREENING  (04/27/2009)   Mammogram action/deferral: Ordered  (04/20/2009)   Mammogram due: 01/2010   Smoking status: never  (07/07/2009)  Diabetes Mellitus   HgbA1C: 7.3  (07/07/2009)   HgbA1C  action/deferral: Ordered  (12/01/2008)    Eye exam: No diabetic retinopathy.     (02/04/2008)   Diabetic eye exam action/deferral: Ophthalmology referral  (07/07/2009)   Eye exam due: 02/2009    Foot exam: yes  (06/25/2008)   High risk foot: Not documented   Foot care education: Not documented    Urine microalbumin/creatinine ratio: 12.2  (04/11/2009)    Diabetes flowsheet reviewed?: Yes   Progress toward A1C goal: Improved  Lipids   Total Cholesterol: 135  (05/21/2008)   LDL: 65  (05/21/2008)   LDL Direct: Not documented   HDL: 47  (05/21/2008)   Triglycerides: 116  (05/21/2008)    SGOT (AST): 24  (04/11/2009)   SGPT (ALT): 26  (04/11/2009)   Alkaline phosphatase: 208  (04/11/2009)   Total bilirubin: 0.5  (04/11/2009)    Lipid flowsheet reviewed?: Yes   Progress toward LDL goal: Unchanged  Hypertension   Last Blood Pressure: 154 / 73  (07/07/2009)   Serum creatinine: 1.28  (04/11/2009)   Serum potassium 4.0  (04/11/2009)    Hypertension flowsheet reviewed?: Yes   Progress toward BP goal: Unchanged  Self-Management Support :   Personal Goals (by the next clinic visit) :     Personal A1C goal: 7  (12/01/2008)     Personal blood pressure goal: 130/80  (12/01/2008)     Personal LDL goal: 100  (12/01/2008)    Patient will work on the following items until the next clinic visit to reach self-care goals:     Medications and monitoring: take my medicines every day, check my blood sugar, examine my feet every day  (07/07/2009)     Eating: eat more vegetables, eat  foods that are low in salt, eat baked foods instead of fried foods, eat fruit for snacks and desserts  (07/07/2009)     Activity: take a 30 minute walk every day  (07/07/2009)    Diabetes self-management support: Education handout, Written self-care plan  (07/07/2009)   Diabetes care plan printed   Diabetes education handout printed   Last diabetes self-management training by diabetes educator: 07/09/2008     Hypertension self-management support: Education handout, Written self-care plan  (07/07/2009)   Hypertension self-care plan printed.   Hypertension education handout printed    Lipid self-management support: Education handout, Written self-care plan  (07/07/2009)   Lipid self-care plan printed.   Lipid education handout printed   Nursing Instructions: Refer for screening diabetic eye exam (see order)    Laboratory Results   Blood Tests   Date/Time Received: July 07, 2009 2:11 PM  Date/Time Reported: Burke Keels  July 07, 2009 2:11 PM   HGBA1C: 7.3%   (Normal Range: Non-Diabetic - 3-6%   Control Diabetic - 6-8%) CBG Random:: 237mg /dL

## 2010-03-09 NOTE — Assessment & Plan Note (Signed)
Summary: ACUTE-ALLERGY PROBLEMS/CFB([facility])   Vital Signs:  Patient profile:   55 year old female Height:      67 inches (170.18 cm) Weight:      288.8 pounds (131.27 kg) BMI:     45.40 O2 Sat:      97 % on Room air Temp:     97.8 degrees F (36.56 degrees C) oral BP sitting:   146 / 84  (left arm)  Vitals Entered By: Krystal Eaton Duncan Dull) (April 11, 2009 10:11 AM)  O2 Flow:  Room air CC: pt c/o shortness of breath with minimal activity x "getting worse" Is Patient Diabetic? Yes Did you bring your meter with you today? No Research Study Name: cbg# 3 glucose 56, pt given one tube of glucose gel.  Had previously been treated with OJx2 for glucose of 60 and 71.  Awaiting pt tray, which has already been ordered, will repeat in 25-20mins.Krystal Eaton [facility])  April 11, 2009 1:26 PM  Pain Assessment Patient in pain? no      Nutritional Status BMI of > 30 = obese CBG Result 60  Have you ever been in a relationship where you felt threatened, hurt or afraid?No   Does patient need assistance? Functional Status Self care Ambulation Normal   Primary Care Provider:  Elby Showers MD  CC:  pt c/o shortness of breath with minimal activity x "getting worse".  History of Present Illness: 55 yo women with pmh as described in emr is here today for new onset shortness of breath. She gets short of breath by walking 1/2 a block before she gets short of breath and has to stop for rest. No orthopnoea or PND. She notes that her feet swells up for last 2 months. No fever, chils or cough. She is asthamatic and she does not feel better when she takes her inhaler after shortness of breath. no wheezing noted. he does c/o chest pain yesterday while she was walking from her truck to [employer], it stayed for sometime and was relieved in the evning. She says that she did not took her GERD meds yesterday and blames the pain to that. No other complaints.  Preventive Screening-Counseling &  Management  Alcohol-Tobacco     Alcohol drinks/day: 0     Smoking Status: never  Problems Prior to Update: 1)  Tonsillar Hypertrophy  (ICD-474.11) 2)  Knee Pain, Right  (ICD-719.46) 3)  Uti  (ICD-599.0) 4)  Ruq Pain  (ICD-789.01) 5)  Cough  (ICD-786.2) 6)  Insomnia  (ICD-780.52) 7)  Fatigue  (ICD-780.79) 8)  Family History Breast Cancer 1st Degree Relative <50  (ICD-V16.3) 9)  Family History of Cad Female 1st Degree Relative <50  (ICD-V17.3) 10)  Family History of Cad Female 1st Degree Relative <60  (ICD-V16.49) 11)  Diverticulosis of Colon  (ICD-562.10) 12)  Gynecological Examination, Routine  (ICD-V72.31) 13)  Vaginal Discharge  (ICD-623.5) 14)  Chest Discomfort  (ICD-786.59) 15)  Ganglion Cyst, Wrist, Right  (ICD-727.41) 16)  Cholelithiasis, Asymptomatic  (ICD-574.20) 17)  Diabetes Mellitus, With Gastroparesis  (ICD-250.60) 18)  Allergic Rhinitis  (ICD-477.9) 19)  Abnormal Pap Smear, Lgsil  (ICD-795.09) 20)  Colonoscopy, Hx of  (ICD-V12.79) 21)  Obesity Nos  (ICD-278.00) 22)  Sleep Apnea  (ICD-780.57) 23)  Hysteroscopy, Hx of  (ICD-V45.89) 24)  Hypertriglyceridemia  (ICD-272.1) 25)  Gastroesophageal Reflux Disease  (ICD-530.81) 26)  Fatty Liver Disease  (ICD-571.8) 27)  Hypertension  (ICD-401.9) 28)  Hyperlipidemia  (ICD-272.4)  Medications Prior  to Update: 1)  Nasonex 50 Mcg/act Susp (Mometasone Furoate) .... Once Daily 2)  Multivitamins Tabs (Multiple Vitamin) .... By Mouth Once Daily 3)  Amlodipine Besylate 10 Mg Tabs (Amlodipine Besylate) .... Take One Tablet Daily For Blood Pressure. 4)  Accupril 40 Mg  Tabs (Quinapril Hcl) .... Take 1 Tablet By Mouth Once A Day 5)  Protonix 40 Mg  Pack (Pantoprazole Sodium) .... Take 1 Tablet By Mouth Once A Day 6)  Allegra 180 Mg  Tabs (Fexofenadine Hcl) .... Take 1 Tablet By Mouth Once A Day 7)  Combivent 103-18 Mcg/act  Aero (Ipratropium-Albuterol) .... Inhale 1-2 Puffs Q4-6 Hrs As Needed 8)  Lipitor 10 Mg Tabs (Atorvastatin  Calcium) .... Take 1 Tablet By Mouth Once A Day 9)  Novolin N 100 Unit/ml Susp (Insulin Isophane Human) .... Inject 85 Units in The Morning and 50 in The Evening. 10)  Freestyle Lite Test  Strp (Glucose Blood) .... Use To Check Blood Sugar Two Times Each Day 11)  Ibuprofen 600 Mg Tabs (Ibuprofen) .... Take One Tablet Three Times A Day For Knee Pain. 12)  Claritin-D 12 Hour 5-120 Mg Xr12h-Tab (Loratadine-Pseudoephedrine) .... Take One Tablet Daily For Allergies.  Current Medications (verified): 1)  Nasonex 50 Mcg/act Susp (Mometasone Furoate) .... Once Daily 2)  Multivitamins Tabs (Multiple Vitamin) .... By Mouth Once Daily 3)  Amlodipine Besylate 10 Mg Tabs (Amlodipine Besylate) .... Take One Tablet Daily For Blood Pressure. 4)  Accupril 40 Mg  Tabs (Quinapril Hcl) .... Take 1 Tablet By Mouth Once A Day 5)  Protonix 40 Mg  Pack (Pantoprazole Sodium) .... Take 1 Tablet By Mouth Once A Day 6)  Allegra 180 Mg  Tabs (Fexofenadine Hcl) .... Take 1 Tablet By Mouth Once A Day 7)  Combivent 103-18 Mcg/act  Aero (Ipratropium-Albuterol) .... Inhale 1-2 Puffs Q4-6 Hrs As Needed 8)  Lipitor 10 Mg Tabs (Atorvastatin Calcium) .... Take 1 Tablet By Mouth Once A Day 9)  Novolin N 100 Unit/ml Susp (Insulin Isophane Human) .... Inject 60 Units in The Morning and 55 in The Evening. 10)  Freestyle Lite Test  Strp (Glucose Blood) .... Use To Check Blood Sugar Two Times Each Day 11)  Ibuprofen 600 Mg Tabs (Ibuprofen) .... Take One Tablet Three Times A Day For Knee Pain. 12)  Claritin-D 12 Hour 5-120 Mg Xr12h-Tab (Loratadine-Pseudoephedrine) .... Take One Tablet Daily For Allergies.  Allergies: 1)  ! Codeine 2)  ! Percocet  Review of Systems      See HPI  Physical Exam  Additional Exam:  Gen: AOx3, in no acute distress Eyes: PERRL, EOMI ENT:MMM, No erythema noted in posterior pharynx Neck: No JVD, No LAP Chest: CTAB with  good respiratory effort, no wheezing.crackles/ronchi CVS: regular rhythmic rate, NO  M/R/G, S1 S2 normal Abdo: soft,ND, BS+x4, Non tender and No hepatosplenomegaly EXT: trace odema noted Neuro: Non focal, gait is normal Skin: no rashes noted.    Impression & Recommendations:  Problem # 1:  DYSPNEA ON EXERTION (ICD-786.09) Assessment New New onset SOB for last 2 months with associated chest pain yesterday is concerning for stable angina or NSTEMI. Patient is also having edema in her legs which is concerning for CHF. We will do a bunch of stat labs on her including Troponins, BNP and CXR and review after the results. We will admit this patient to telemetry for a rule out MI. Rest of the H and P and plan will be as per primary team. Her updated medication list for this  problem includes:    Accupril 40 Mg Tabs (Quinapril hcl) .Marland Kitchen... Take 1 tablet by mouth once a day    Combivent 103-18 Mcg/act Aero (Ipratropium-albuterol) ..... Inhale 1-2 puffs q4-6 hrs as needed  Orders: T-BNP  (B Natriuretic Peptide) ([id number]) T-CBC w/Diff ([id number]) 12 Lead EKG (12 Lead EKG) T-Troponin I ([id number]) CXR- 2view (CXR)  Problem # 2:  INSOMNIA (ICD-780.52) Assessment: Deteriorated  Patient is unable to sleep well at night and wakes up every hour. I will check her UA and evaluate whether we eed to send her back for a sleep study to evaluate for sleep apnea.  Problem # 3:  FAMILY HISTORY OF CAD FEMALE 1ST DEGREE RELATIVE <60 (ICD-V16.49) We are really concerend about the new Onset SOB and will evaluate her 2D echo for any new wall motion abnormalities.  Complete Medication List: 1)  Nasonex 50 Mcg/act Susp (Mometasone furoate) .... Once daily 2)  Multivitamins Tabs (Multiple vitamin) .... By mouth once daily 3)  Amlodipine Besylate 10 Mg Tabs (Amlodipine besylate) .... Take one tablet daily for blood pressure. 4)  Accupril 40 Mg Tabs (Quinapril hcl) .... Take 1 tablet by mouth once a day 5)  Protonix 40 Mg Pack (Pantoprazole sodium) .... Take 1 tablet by mouth once a day 6)   Allegra 180 Mg Tabs (Fexofenadine hcl) .... Take 1 tablet by mouth once a day 7)  Combivent 103-18 Mcg/act Aero (Ipratropium-albuterol) .... Inhale 1-2 puffs q4-6 hrs as needed 8)  Lipitor 10 Mg Tabs (Atorvastatin calcium) .... Take 1 tablet by mouth once a day 9)  Novolin N 100 Unit/ml Susp (Insulin isophane human) .... Inject 60 units in the morning and 55 in the evening. 10)  Freestyle Lite Test Strp (Glucose blood) .... Use to check blood sugar two times each day 11)  Ibuprofen 600 Mg Tabs (Ibuprofen) .... Take one tablet three times a day for knee pain. 12)  Claritin-d 12 Hour 5-120 Mg Xr12h-tab (Loratadine-pseudoephedrine) .... Take one tablet daily for allergies.  Other Orders: T-Hgb A1C (in-house) [phone]) T- Capillary Blood Glucose ([id number]) T-Comprehensive Metabolic Panel ([id number]) T-Urinalysis ([id number]) T-Urine Microalbumin w/creat. ratio [phone]) Process Orders Check Orders Results:     [facility]: ABN not required for this insurance Tests Sent for requisitioning (April 11, 2009 1:48 PM):     04/11/2009: [facility] -- T-Comprehensive Metabolic Panel [[id number]] (signed)     04/11/2009: [facility] -- T-BNP  (B Natriuretic Peptide) [phone] (signed)     04/11/2009: [facility] -- T-CBC w/Diff [[id number]] (signed)     04/11/2009: [facility] -- T-Urinalysis [[id number]] (signed)     04/11/2009: [facility] -- T-Urine Microalbumin w/creat. ratio [[id number]] (signed)     04/11/2009: [facility] -- T-Troponin I [phone] (signed)  Assessed arms for IV access.  None easily viewed.  IV Team called. Angelina Ok, RN  April 11, 2009 12:49 PM  Laboratory Results   Blood Tests   Date/Time Received: April 11, 2009 10:30 AM  Date/Time Reported: Burke Keels  April 11, 2009 10:30 AM   HGBA1C: 7.7%   (Normal Range: Non-Diabetic  - 3-6%   Control Diabetic - 6-8%) CBG Random:: 60mg /dL

## 2010-03-09 NOTE — Progress Notes (Signed)
Summary: Refill/gh  Phone Note Refill Request Message from:  Fax from Pharmacy on February 07, 2009 4:15 PM  Refills Requested: Medication #1:  NASONEX 50 MCG/ACT SUSP once daily   Last Refilled: 11/22/2008  Method Requested: Electronic Initial call taken by: Angelina Ok RN,  February 07, 2009 4:15 PM  Follow-up for Phone Call        signed.  please call in.  thank you.    Prescriptions: NASONEX 50 MCG/ACT SUSP (MOMETASONE FUROATE) once daily  #21month x 3   Entered and Authorized by:   Elby Showers MD   Signed by:   Elby Showers MD on 02/07/2009   Method used:   Telephoned to ...       [facility] Department (retail)       [address] [place], [place]  [address]       Ph: [phone]       Fax: [phone]   RxID:   [id number]

## 2010-03-09 NOTE — Progress Notes (Signed)
Summary: Refill/gh  Phone Note Refill Request Message from:  Fax from Pharmacy on January 31, 2010 3:41 PM  Refills Requested: Medication #1:  AMLODIPINE BESYLATE 10 MG TABS Take one tablet daily for blood pressure.   Last Refilled: 11/17/2009  Medication #2:  PROTONIX 40 MG  PACK Take 1 tablet by mouth once a day   Last Refilled: 11/30/2009 Last vivit was 12/24/2009.  Last labs were 04/11/2009   Method Requested: Fax to Local Pharmacy Initial call taken by: Angelina Ok RN,  January 31, 2010 3:43 PM  Follow-up for Phone Call        Refill approved-nurse to complete Follow-up by: Julaine Fusi  DO,  January 31, 2010 3:50 PM    Prescriptions: PROTONIX 40 MG  PACK (PANTOPRAZOLE SODIUM) Take 1 tablet by mouth once a day  #31 x 6   Entered by:   Julaine Fusi  DO   Authorized by:   Marland Kitchen OPC ATTENDING DESKTOP   Signed by:   Julaine Fusi  DO on 01/31/2010   Method used:   Faxed to ...       [facility] (retail)       [address]       [place], [place]  [address]       Ph: [phone]       Fax: [phone]   RxID:   [id number] AMLODIPINE BESYLATE 10 MG TABS (AMLODIPINE BESYLATE) Take one tablet daily for blood pressure.  #32 x 6   Entered by:   Julaine Fusi  DO   Authorized by:   Marland Kitchen OPC ATTENDING DESKTOP   Signed by:   Julaine Fusi  DO on 01/31/2010   Method used:   Faxed to ...       [facility] (retail)       [address]       [place], [place]  [address]       Ph: [phone]       Fax: [phone]   RxID:   [id number]

## 2010-03-09 NOTE — Progress Notes (Signed)
Summary: med refil/gp  Phone Note Refill Request Message from:  Patient on Jul 05, 2009 3:49 PM   Request refill on Humulin N Pen - states she takes 50 units in the am and 40 units in the pm; this will changed after she d/c from the hospital.   Method Requested: Telephone to Pharmacy Initial call taken by: Chinita Pester RN,  Jul 05, 2009 3:50 PM  Follow-up for Phone Call        Refill approved-nurse to complete    New/Updated Medications: NOVOLIN N 100 UNIT/ML SUSP (INSULIN ISOPHANE HUMAN) Inject 50 units in the morning and 40 in the evening. Prescriptions: NOVOLIN N 100 UNIT/ML SUSP (INSULIN ISOPHANE HUMAN) Inject 50 units in the morning and 40 in the evening.  #5 vials x 3   Entered and Authorized by:   Elby Showers MD   Signed by:   Elby Showers MD on 07/07/2009   Method used:   Telephoned to ...       [facility] Department (retail)       [address] [place], [place]  [address]       Ph: [phone]       Fax: [phone]   RxID:   [id number]   Appended Document: med refil/gp Above Rx called to [facility] pharmacy.

## 2010-03-09 NOTE — Progress Notes (Signed)
Summary: refill/ hla  Phone Note Refill Request Message from:  Fax from Pharmacy on February 07, 2010 9:32 AM  Refills Requested: Medication #1:  ACCUPRIL 40 MG  TABS Take 1 tablet by mouth once a day   Dosage confirmed as above?Dosage Confirmed   Last Refilled: 9/20 #90 each dispense last visit 01/03/10 last labs 04/2009  Initial call taken by: Marin Roberts RN,  February 07, 2010 9:33 AM  Follow-up for Phone Call        Has jan appt Follow-up by: Blanch Media MD,  February 09, 2010 1:57 PM    Prescriptions: ACCUPRIL 40 MG  TABS (QUINAPRIL HCL) Take 1 tablet by mouth once a day  #90 x 0   Entered and Authorized by:   Blanch Media MD   Signed by:   Blanch Media MD on 02/09/2010   Method used:   Faxed to ...       [facility] Department (retail)       [address] [place], [place]  [address]       Ph: [phone]       Fax: [phone]   RxID:   [id number]

## 2010-03-09 NOTE — Progress Notes (Signed)
Summary: refill/ hla  Phone Note Refill Request Message from:  Fax from Pharmacy on May 19, 2009 3:31 PM  Refills Requested: Medication #1:  NASONEX 50 MCG/ACT SUSP once daily   Last Refilled: 1/7 last visit 3/16 and last labs 3/7  Initial call taken by: Marin Roberts RN,  May 19, 2009 3:31 PM  Follow-up for Phone Call        Refill approved-nurse to complete    Prescriptions: NASONEX 50 MCG/ACT SUSP (MOMETASONE FUROATE) once daily  #56month x 3   Entered and Authorized by:   Elby Showers MD   Signed by:   Elby Showers MD on 05/24/2009   Method used:   Telephoned to ...       [facility] Department (retail)       [address] [place], [place]  [address]       Ph: [phone]       Fax: [phone]   RxID:   [id number]   Appended Document: refill/ hla Nasonex rx refill request faxed to [facility] pharmacy.

## 2010-03-09 NOTE — Assessment & Plan Note (Signed)
Summary: RT KNEE INJECTION,MC   Vital Signs:  Patient profile:   55 year old female BP sitting:   144 / 80  Vitals Entered By: Lillia Pauls CMA (February 24, 2010 9:37 AM)  Primary Care Provider:  Whitney Post MD   History of Present Illness: 55 yo F with Rt knee OA here for repeat CSI.  Last CSI in 04/11, got 3 months of relief.  Pain located anteromedial, does keep her up at night, also worse with walking.  Taking occasional ibuprofen 200 mg, no tylenol or glucosamine.  Not doing much exercise.  Has lost 6 lbs over last 2 months.  Not working.  Also mild low back for last few months, centrally located.  No radiation, numbness, or paresthesias into legs.  No bowel/bladder changes.  Allergies: 1)  ! Codeine 2)  ! Percocet  Physical Exam  General:  overweight-appearing.   Msk:  Rt knee: no effusion.  Stable ligaments.  No erythema.  + medial joint line ttp with osteophytes.  + crepitus  Back: Minimal ttp over central low back in lower lumbar region.  Mild b/l muscle spasm.  Neg SLR seated b/l. Psych:  normally interactive.     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 Sterile betadine prep. Furthur cleansed with alcohol. Topical analgesic spray: Ethyl chloride. Joint: Rt knee Approached in typical fashion with: anteromedial approach Completed without difficulty Meds: 1cc kenalog 40 mg, 4 cc 1% lidocaine Needle: 23 G Aftercare instructions and Red flags advised.  Orders: Joint Aspirate / Injection, Large (20610) Kenalog 10 mg inj (J3301)  Problem # 2:  LOW BACK PAIN, MILD (ICD-724.2) Likely  some DJD and chronic muscle spasm. No red flags - discussed weight loss and need for daily exercise - tylenol for as needed - f/u prn  Complete Medication List: 1)  Nasonex 50 Mcg/act Susp (Mometasone furoate) .... Once daily 2)  Amlodipine Besylate 10 Mg Tabs (Amlodipine besylate) .... Take one tablet daily for blood pressure. 3)  Accupril 40 Mg Tabs (Quinapril hcl) .... Take 1 tablet by mouth once a day 4)  Protonix 40 Mg Pack (Pantoprazole sodium) .... Take 1 tablet by mouth once a day 5)  Combivent 103-18 Mcg/act Aero (Ipratropium-albuterol) .... Inhale 1-2 puffs q4-6 hrs as needed 6)  Lipitor 10 Mg Tabs (Atorvastatin calcium) .... Take 1 tablet by mouth once a day 7)  Humulin N Pen 100 Unit/ml Susp (Insulin isophane human) .... Inject 50 units in the morning and 45 in the evening. 8)  Freestyle Lite Test Strp (Glucose blood) .... Use to check blood sugar two times each day 9)  Zolpidem Tartrate 6.25 Mg Cr-tabs (Zolpidem tartrate) .... Take 1 tablet by mouth once a day at bedtime 10)  Allergy Relief 180 Mg Tabs (Fexofenadine hcl) .... Take 1 tablet by mouth once a day   Orders Added: 1)  Est. Patient Level III [[address]] 2)  Joint Aspirate / Injection, Large [20610] 3)  Kenalog 10 mg inj [J3301]

## 2010-03-09 NOTE — Progress Notes (Signed)
Summary: refill/gg  Phone Note Refill Request  on May 16, 2009 1:48 PM  Refills Requested: Medication #1:  ACCUPRIL 40 MG  TABS Take 1 tablet by mouth once a day   Last Refilled: 02/08/2009  Method Requested: Fax to Local Pharmacy Initial call taken by: Merrie Roof RN,  May 16, 2009 1:49 PM  Follow-up for Phone Call        Refill approved-nurse to complete    Prescriptions: ACCUPRIL 40 MG  TABS (QUINAPRIL HCL) Take 1 tablet by mouth once a day  #31 x 6   Entered and Authorized by:   Elby Showers MD   Signed by:   Elby Showers MD on 05/16/2009   Method used:   Telephoned to ...       [facility] Department (retail)       [address] [place], [place]  [address]       Ph: [phone]       Fax: [phone]   RxID:   [id number]

## 2010-03-09 NOTE — Letter (Signed)
Summary: TWO WEEK GLUCOSE SUMMARY  TWO WEEK GLUCOSE SUMMARY   Imported By: Margie Billet 04/22/2009 14:55:03  _____________________________________________________________________  External Attachment:    Type:   Image     Comment:   External Document

## 2010-03-09 NOTE — Miscellaneous (Signed)
Summary: Hospital Admission:04/11/2009 - chest pain, ACS rule out  INTERNAL MEDICINE ADMISSION HISTORY AND PHYSICAL  ADMISSION DATE: 04/11/2009  ATTENDING: DR. Phillips Odor R1 - DR. SIDHU [phone] R2 - DR. MAGICK [phone]   PCP: DR. Clent Ridges  CC: CHEST PAIN  HPI: 55 yo women with PMH of DM2, HLD, HTN, morbid obesity presented to [facility] with main concern of progressively worsening dyspnea on exertion that initially started about 3 months ago (low worse in the past 2 weeks). She reports not being able to walk more than 1/2 block without running short of breath. It is occasionally but not consistently associated with chills and chronic nonproductive cough but not with fevers, chest pain or other systemic symptoms. She has also noticed LE swelling that has been getting progressively worse as well over the past 3 months. She report that SOB gets better with rest with no other specific aggrevating or alleviating factors. She denies any abdomianl or urinary concerns. She has normal BM, no diarrhea or constipation, no hematuria. She denies orthopnea or PND. She does mention possible sick contacts at home, her 2 foster childern had stomach flu 2 weeks ago.     ALLERGIES: CODEINE PERCOCET Both give itching and nausea  PAST MEDICAL HISTORY: DM2 with gastroparesis Hyperlipidemia Hypertension Allergic rhinitis Fatty liver Obesity OSA that resolved post tonsilectomy 07/05. followed by Dr. Gerilyn Pilgrim Cholelithiasis with no signs of cholecystitis - [place] 12/09  MEDICATIONS: NASONEX 50 MCG/ACT SUSP QD MULTIVITAMINS TABS QD AMLODIPINE BESYLATE 10 MG TAB QD ACCUPRIL 40 MG  TAB QD PROTONIX 40 MG  PACK QD ALLEGRA 180 MG  TABS QD COMBIVENT 103-18 MCG/ACT  AER Inhale 1-2 puffs q4-6 hrs as needed LIPITOR 10 MG TAB QD NOVOLIN N 100 UNIT/ML SUSP  Inject 60 units in the morning and 55 in the evening. IBUPROFEN 600 MG TAB Take one tablet three times a day for knee pain. 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 Divorced Never Smoked Alcohol use-no Drug use-no Regular exercise-no  FAMILY HISTORY: Family History of CAD Female 1st degree relative (father) Family History of CAD Female 1st degree relative  (mother) Family History Breast cancer 1st degree relative <50 (aunt)  ROS: per HPI  VITALS: T: 97.8 F  P: 88 BP: 146/84  R: 18  O2SAT:  97% ON: RA  PHYSICAL EXAM:  General: NAD, cooperative to examination HEENT: atraumatic, PERRLA, EOMI, no scleral icterus, no oropharyngeal erythema, good dental care, no postnasal drip Neck: supple, full ROM, no thyroid enlargement Resp: good air movement, mild expiratory wheezing, no ronchi, no tachypnea  CVS: RRR, S1/S2, no murmurs, no gallops, no rubs, no JVD ABD: soft, NT/ND, BS +, no rebound, no guarding SKIN: good skin turgor, no rashes EXT: +1 bilateral pitting edema, no cyanosis, good DP and PT pulses bilateraly MSK: no joint effusions, no tenderness, full ROM in all extremities  NEURO: A & Ox3, strength 5/5 in all 4 extremities and equal, sensation intact to soft touch and position, normal f-to-nose, normal h-to-sheen, Romberg -, no ataxia, no asterixis, CNIII-XII grossly intact PSYCH: appropriate   LABS:  Na    143 K   4.0 Cl   104 CO2   28 Glu   71 BUN   15 Cr   1.28  Bili total   0.5 Alk Phos   208 ASt     24 ALT     26 Tot prot   7.7  WBC  11.4 HG   12.2 PLT   339 MCV   79 ANC   8  CE X1 NEGATIVE UA   NEGATIVE BNP    <30  EKG: NSR AT 88 BPM, NONSPECIFIC t WAVE CHANGES IN LEAD III, NO ST SEGMENT ELEVATIONS OR DEPRESSIONS, NORMAL AXIS, GOOD R WAVE PROGRESSION  CXR: NO ACTIVE CARDIOPULMONARY DISEASE   ASSESSMENT AND PLAN:  (1) SOB - initially pt reported CP in the clinic as well but denied having any on my interview, but if that was the initial report, the symptoms are most worrisome for ACS/unstable angina. She certainly has multiple risk  factors such as family history, HLD, HTN, DM2 with most recent A1C today in clinic 7.7). Her labs currently show no specific abnormalities, EKG is WNL, and the 1st set of CE is WNL as well. BNP is <30 and CXR is negative for active cardiopulmonary disease. Will continue to cycle enzymes and repeat EKG, will also check TSH, UDS. Will continue her home regimen with combivent and nasonex. Will also monitor her O2 sats. Will continue statin but will hold lisinopril in the setting of mildly elevated Cr. This can probably be resumed tomorrow provided her renal function gets to baseline. PE is another potential etiology and will therfore discuss with attending if d-dimer necessary as well as further work up. Pt has no history of PE.  Her last 2D ECHO was 07/2003 and at that time EF was 55-60% so will have to get a new ECHO and readjust the regimen accordingly. Now this worsening SOB could as well be associated with viral infection, she could have had this long standing SOB that was now exacerbated by viral illness so that is why we are going to monitor vitals and WBCs.  (2) HTN - continue Amlodipine, monitor BP, hold Lisinopril due to mild renal insufficiency  (3) Mild renal insufficiency - possible secondary to lisninopril vs dehydration. Check U Na and U Cr, gently hydrate and follow up in the the AM.  (4) DM: no need to check A1C as it was done in clinic 03./08/2009 (was 7.7), monitor CBG QAC and HS, start SSI moderate coverage and insulin novolin 20 units in the AM and 20 units in the PM. Her home dose is much higher (even higher that what we have in EMR) but will start low since her CBG in the clinic was 53. Will readjust as indicated.    (5) HLD - continue home regimen and check FLP  (6) VTE PROPH: lovenox

## 2010-03-09 NOTE — Progress Notes (Signed)
  Phone Note Outgoing Call   Call placed by: Theotis Barrio NT II,  October 17, 2009 9:51 AM Call placed to: Patient Details for Reason: [facility] Summary of Call: PATIENT CONTACTED BY PHONE OF THIS APPT WITH THE [facility] FOR SEPT 15, 011 @ 8:00AM / [id number] / [phone]

## 2010-03-09 NOTE — Progress Notes (Signed)
Summary: refill/gg  Phone Note Refill Request  on September 23, 2009 3:40 PM  Refills Requested: Medication #1:  NASONEX 50 MCG/ACT SUSP once daily  Method Requested: Fax to Local Pharmacy Initial call taken by: Merrie Roof RN,  September 23, 2009 3:41 PM  Follow-up for Phone Call        Last seen June. 3 month F/U but none scheduled. Sent flag to Ms Lissa Hoard to make appt in Sep Follow-up by: Blanch Media MD,  September 23, 2009 3:58 PM    Prescriptions: NASONEX 50 MCG/ACT SUSP (MOMETASONE FUROATE) once daily  #one x 5   Entered and Authorized by:   Blanch Media MD   Signed by:   Blanch Media MD on 09/23/2009   Method used:   Faxed to ...       [facility] (retail)       [address]       [place], [place]  [address]       Ph: [phone]       Fax: [phone]   RxID:   [id number]

## 2010-03-09 NOTE — Progress Notes (Signed)
Summary: Refill/gh  Phone Note Refill Request Message from:  Fax from Pharmacy on October 20, 2009 4:49 PM  Refills Requested: Medication #1:  LIPITOR 10 MG TABS Take 1 tablet by mouth once a day   Last Refilled: 07/27/2009 Last visit 07/07/2009.  Last labs were in 04/2009.   Method Requested: Fax to Local Pharmacy Initial call taken by: Angelina Ok RN,  October 20, 2009 4:49 PM  Follow-up for Phone Call       Follow-up by: Blanch Media MD,  October 20, 2009 5:10 PM    Prescriptions: LIPITOR 10 MG TABS (ATORVASTATIN CALCIUM) Take 1 tablet by mouth once a day  #31 x 5   Entered and Authorized by:   Blanch Media MD   Signed by:   Blanch Media MD on 10/20/2009   Method used:   Faxed to ...       [facility] Department (retail)       [address] [place], [place]  [address]       Ph: [phone]       Fax: [phone]   RxID:   [id number]

## 2010-03-09 NOTE — Letter (Signed)
Summary: PFT's  [facility]HEEZE FREQUENT   Imported By: Margie Billet 06/02/2009 12:18:10  _____________________________________________________________________  External Attachment:    Type:   Image     Comment:   External Document

## 2010-03-09 NOTE — Op Note (Signed)
Summary: Consent  Consent   Imported By: Marily Memos 02/24/2010 10:11:18  _____________________________________________________________________  External Attachment:    Type:   Image     Comment:   External Document

## 2010-03-09 NOTE — Progress Notes (Signed)
Summary: change pens/ hla  Phone Note Call from Patient   Caller: Patient Reason for Call: Refill Medication Details for Reason: Needs script for Humulin Pen instead of Novolin as we have done in the past. Summary of Call: Told patient we would call the new prescription to [facility]. Please call to [facility]. Initial call taken by: Nilda Riggs MD,  July 07, 2009 5:04 PM  Follow-up for Phone Call        the health dept stocks only novolin n pens, may we change? Follow-up by: Marin Roberts RN,  July 08, 2009 10:10 AM  Additional Follow-up for Phone Call Additional follow up Details #1::        That is fine. - I cannot find a novolin pen option in the medication lists - but I sent a script for novolin and the patient called back saying that they would not accept that. Thanks for your help with this. Additional Follow-up by: Nilda Riggs MD,  July 08, 2009 10:28 AM    Additional Follow-up for Phone Call Additional follow up Details #2::    called to pharm Follow-up by: Marin Roberts RN,  July 08, 2009 5:04 PM  New/Updated Medications: HUMULIN N PEN 100 UNIT/ML SUSP (INSULIN ISOPHANE HUMAN) Inject 50 units in the morning and 40 in the evening. Prescriptions: HUMULIN N PEN 100 UNIT/ML SUSP (INSULIN ISOPHANE HUMAN) Inject 50 units in the morning and 40 in the evening.  #5 x 3   Entered and Authorized by:   Nilda Riggs MD   Signed by:   Nilda Riggs MD on 07/07/2009   Method used:   Telephoned to ...       [facility] Department (retail)       [address] [place], [place]  [address]       Ph: [phone]       Fax: [phone]   RxID:   [id number]

## 2010-03-09 NOTE — Progress Notes (Signed)
Summary: med refill/gp  Phone Note Refill Request Message from:  Fax from Pharmacy on August 24, 2009 9:58 AM  Refills Requested: Medication #1:  COMBIVENT 103-18 MCG/ACT  AERO Inhale 1-2 puffs q4-6 hrs as needed   Last Refilled: 04/28/2009 Last appt. June 2.   Method Requested: Fax to Local Pharmacy Initial call taken by: Chinita Pester RN,  August 24, 2009 9:59 AM  Follow-up for Phone Call        Refill approved-nurse to complete Follow-up by: Julaine Fusi  DO,  August 24, 2009 12:19 PM    Prescriptions: COMBIVENT 103-18 MCG/ACT  AERO (IPRATROPIUM-ALBUTEROL) Inhale 1-2 puffs q4-6 hrs as needed  #1 x 5   Entered and Authorized by:   Julaine Fusi  DO   Signed by:   Julaine Fusi  DO on 08/24/2009   Method used:   Faxed to ...       [facility] Department (retail)       [address] [place], [place]  [address]       Ph: [phone]       Fax: [phone]   RxID:   [id number]

## 2010-03-09 NOTE — Assessment & Plan Note (Signed)
Summary: FU [facility]) SB.   Vital Signs:  Patient profile:   55 year old female Height:      67 inches (170.18 cm) Weight:      286.7 pounds (130.32 kg) BMI:     45.07 Temp:     97.1 degrees F (36.17 degrees C) oral Pulse rate:   91 / minute BP sitting:   138 / 76  (right arm)  Vitals Entered By: Stanton Kidney Ditzler RN (April 20, 2009 10:01 AM) Is Patient Diabetic? Yes Did you bring your meter with you today? Yes Pain Assessment Patient in pain? no      Nutritional Status BMI of > 30 = obese Nutritional Status Detail appetite good  Have you ever been in a relationship where you felt threatened, hurt or afraid?denies   Does patient need assistance? Functional Status Self care Ambulation Normal Comments Pt did CBG 79 this AM. FU - doing well.   Primary Care Provider:  Elby Showers MD   History of Present Illness: Pt is a 55 yo female w/ past med hx below here for hospital f/u.  She was d/c'd last week and hospitalized for SOB.  She notes it is not much better and she is also feeling fatigued.  It is exertional and denies cough, chest pain, runny nose, and sore throat.  She also notes feeling tired during the day but denies HA's.  She snores regularly.    She has brought her meter with her to f/u on her diabetes.  She does not check her sugars daily but on her meter her blood sugars have been b/w 79-150's.  Depression History:      The patient denies a depressed mood most of the day and a diminished interest in her usual daily activities.         Preventive Screening-Counseling & Management  Alcohol-Tobacco     Alcohol drinks/day: 0     Smoking Status: never  Caffeine-Diet-Exercise     Does Patient Exercise: yes     Type of exercise: walking     Times/week: 3  Current Medications (verified): 1)  Nasonex 50 Mcg/act Susp (Mometasone Furoate) .... Once Daily 2)  Multivitamins Tabs (Multiple Vitamin) .... By Mouth Once Daily 3)  Amlodipine Besylate 10 Mg Tabs (Amlodipine  Besylate) .... Take One Tablet Daily For Blood Pressure. 4)  Accupril 40 Mg  Tabs (Quinapril Hcl) .... Take 1 Tablet By Mouth Once A Day 5)  Protonix 40 Mg  Pack (Pantoprazole Sodium) .... Take 1 Tablet By Mouth Once A Day 6)  Combivent 103-18 Mcg/act  Aero (Ipratropium-Albuterol) .... Inhale 1-2 Puffs Q4-6 Hrs As Needed 7)  Lipitor 10 Mg Tabs (Atorvastatin Calcium) .... Take 1 Tablet By Mouth Once A Day 8)  Novolin N 100 Unit/ml Susp (Insulin Isophane Human) .... Inject 80 Units in The Morning and 50 in The Evening. 9)  Freestyle Lite Test  Strp (Glucose Blood) .... Use To Check Blood Sugar Two Times Each Day 10)  Claritin-D 12 Hour 5-120 Mg Xr12h-Tab (Loratadine-Pseudoephedrine) .... Take One Tablet Daily For Allergies.  Allergies: 1)  ! Codeine 2)  ! Percocet  Past History:  Past Medical History: Last updated: 02/09/2008 Diabetes mellitus, type II with gastroparesis Hyperlipidemia Hypertension Allergic rhinitis Fatty liver Obesity OSA that resolved post tonsilectomy 07/05. followed by Dr. 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  Eyes:  anicteric.  Breasts:  No mass, nodules, thickening, tenderness, bulging, retraction, inflamation, nipple discharge or skin changes noted.   Lungs:  Normal respiratory effort, chest expands symmetrically. Lungs are clear to auscultation, no crackles or wheezes. Heart:  Normal rate and regular rhythm. S1 and S2 normal without gallop, murmur, click, rub or other extra sounds. Abdomen:  +BS's, soft, NT, obese,  and ND. Genitalia:  cervix partially  visualized and appeared nl without lesions.  scant-moderate white, thick discharge, no vaginal wall erythema, normal introitus, no external lesions, uterus absent.  Extremities:  trace bilateral lower extremity edema.  no clubbing.  Neurologic:  gait normal.  Axillary Nodes:  No palpable lymphadenopathy Psych:  mood euthymic.    Impression & Recommendations:  Problem # 1:  DYSPNEA ON EXERTION (ICD-786.09) D/c summary recommends sleep study and PFT's which have been ordered.  WIll f/u to go over results w/ her.  If these are unrevealing, it may be worthwhile to send for cardiac eval.  Her updated medication list for this problem includes:    Accupril 40 Mg Tabs (Quinapril hcl) .Marland Kitchen... Take 1 tablet by mouth once a day    Combivent 103-18 Mcg/act Aero (Ipratropium-albuterol) ..... Inhale 1-2 puffs q4-6 hrs as needed  Orders: Split Night (Split Night) PFT Baseline (PFT Baseline) PFT Basline & Lung Volume (PFT Baseline-Lung  V) PFT Baseline-Pre/Post Bronchodiolator (PFT Baseline-Pre/Pos) PFT's Baseline w/ DLCO (PFT's Baseline-DLCO)  Problem # 2:  FATIGUE (ICD-780.79) Will f/u sleep study.  Thyroid studies done while inpt normal.  Orders: Split Night (Split Night)  Problem # 3:  DIABETES MELLITUS, WITH GASTROPARESIS (ICD-250.60) CBG this am on her meter 79.  Per her meter results, she has good results and she has adjusted her insulin recently.  SHe is up to date on eye exam on her hx, and lipids were done inpt and were at goal.  Cont ACE inh.  Her updated medication list for this problem includes:    Accupril 40 Mg Tabs (Quinapril hcl) .Marland Kitchen... Take 1 tablet by mouth once a day    Novolin N 100 Unit/ml Susp (Insulin isophane human) ..... Inject 80 units in the morning and 50 in the evening.  Problem # 4:  ABNORMAL PAP SMEAR, LGSIL (ICD-795.09) Pap smear done today, complete cervix hard to visualize but if pap ok, do not think we need to repeat sooner than 1 year.  No palpable abnoramlities  noted.   Orders: T-Wet Prep by Molecular Probe [phone]) T-PAP [facility]) [phone]) T-Chlamydia & GC Probe, Genital ([id number])  Problem # 5:  Preventive Health Care (ICD-V70.0) Mammogram ordered.   Problem # 6:  HYPERLIPIDEMIA (ICD-272.4) Lipids done while inpt 03/11 and WNL.  Her updated medication list for this problem includes:    Lipitor 10 Mg Tabs (Atorvastatin calcium) .Marland Kitchen... Take 1 tablet by mouth once a day  Complete Medication List: 1)  Nasonex 50 Mcg/act Susp (Mometasone furoate) .... Once daily 2)  Multivitamins Tabs (Multiple vitamin) .... By mouth once daily 3)  Amlodipine Besylate 10 Mg Tabs (Amlodipine besylate) .... Take one tablet daily for blood pressure. 4)  Accupril 40 Mg Tabs (Quinapril hcl) .... Take 1 tablet by mouth once a day 5)  Protonix 40 Mg Pack (Pantoprazole sodium) .... Take 1 tablet by mouth once a day 6)  Combivent 103-18 Mcg/act Aero (Ipratropium-albuterol) .... Inhale 1-2 puffs q4-6 hrs as needed 7)  Lipitor 10 Mg Tabs (Atorvastatin calcium) .... Take 1 tablet by mouth once a day 8)  Novolin N 100 Unit/ml Susp (Insulin isophane human) .... Inject 80 units in the morning and 50 in the evening. 9)  Freestyle Lite Test Strp (Glucose blood) .... Use to check blood sugar two times each day 10)  Claritin-d 12 Hour 5-120 Mg Xr12h-tab (Loratadine-pseudoephedrine) .... Take one tablet daily for allergies.  Other Orders: Mammogram (Screening) (Mammo)  Patient Instructions: 1)  Please make a followup appointment in 3-4 weeks to check on your symptoms and go over your results. 2)  Call sooner if you need anything. 3)  Please get your lung test and sleep study done.  Process Orders Check Orders Results:     [facility]: ABN not required for this insurance Tests Sent for requisitioning (April 20, 2009 10:46 AM):     04/20/2009: [facility] -- T-Wet Prep by Molecular Probe [phone] (signed)     04/20/2009:  [facility] -- T-Chlamydia & GC Probe, Genital [[id number]] (signed)    Prevention & Chronic Care Immunizations   Influenza vaccine: Fluvax 3+  (10/29/2007)   Influenza vaccine due: 11/05/2009    Tetanus booster: Not documented   Td booster deferral: Deferred  (04/20/2009)    Pneumococcal vaccine: Not documented   Pneumococcal vaccine deferral: Deferred  (04/20/2009)  Colorectal Screening   Hemoccult: Not documented    Colonoscopy: Diverticulosis in descending colon.  (01/23/2005)   Colonoscopy due: 01/24/2015  Other Screening   Pap smear:  Specimen Adequacy: Satisfactory for evaluation.   Interpretation/Result:Negative for intraepithelial Lesion or Malignancy.   Interpretation/Result:Fungal organisms c/w Candida present.      (08/21/2006)   Pap smear action/deferral: Deferred  (04/20/2009)   Pap smear due: 09/2008    Mammogram: ASSESSMENT: Negative - BI-RADS 1^MM DIGITAL SCREENING  (01/13/2008)   Mammogram action/deferral: Ordered  (04/20/2009)   Mammogram due: 01/2010   Smoking status: never  (04/20/2009)  Diabetes Mellitus   HgbA1C: 7.7  (04/11/2009)   HgbA1C action/deferral: Ordered  (12/01/2008)    Eye exam: No diabetic retinopathy.     (02/04/2008)   Eye exam due: 02/2009    Foot exam: yes  (06/25/2008)   High risk foot: Not documented   Foot care education: Not documented    Urine microalbumin/creatinine ratio: 12.2  (04/11/2009)    Diabetes flowsheet reviewed?: Yes   Progress toward A1C goal: Unchanged  Lipids   Total Cholesterol: 135  (05/21/2008)   LDL: 65  (05/21/2008)   LDL Direct: Not documented   HDL: 47  (05/21/2008)   Triglycerides: 116  (05/21/2008)    SGOT (AST): 24  (04/11/2009)   SGPT (ALT): 26  (04/11/2009)   Alkaline phosphatase: 208  (04/11/2009)   Total bilirubin: 0.5  (04/11/2009)    Lipid flowsheet reviewed?: Yes   Progress toward LDL goal: At goal  Hypertension   Last Blood Pressure: 138 / 76   (04/20/2009)   Serum creatinine: 1.28  (04/11/2009)   Serum potassium 4.0  (04/11/2009)    Hypertension flowsheet reviewed?: Yes   Progress toward BP goal: At goal  Self-Management Support :   Personal Goals (by the next clinic visit) :     Personal A1C goal: 7  (12/01/2008)     Personal blood pressure goal: 130/80  (12/01/2008)     Personal LDL goal: 100  (12/01/2008)    Patient will work  on the following items until the next clinic visit to reach self-care goals:     Medications and monitoring: take my medicines every day, check my blood sugar, bring all of my medications to every visit  (04/20/2009)     Eating: drink diet soda or water instead of juice or soda, eat more vegetables, use fresh or frozen vegetables, eat foods that are low in salt, eat fruit for snacks and desserts, limit or avoid alcohol  (04/20/2009)     Activity: take a 30 minute walk every day  (04/20/2009)    Diabetes self-management support: Written self-care plan, Education handout, Resources for patients handout  (04/20/2009)   Diabetes care plan printed   Diabetes education handout printed   Last diabetes self-management training by diabetes educator: 07/09/2008    Hypertension self-management support: Education handout, Written self-care plan, Resources for patients handout  (04/20/2009)   Hypertension self-care plan printed.   Hypertension education handout printed    Lipid self-management support: Education handout, Written self-care plan, Resources for patients handout  (04/20/2009)   Lipid self-care plan printed.   Lipid education handout printed      Resource handout printed.   Nursing Instructions: Schedule screening mammogram (see order)

## 2010-03-13 ENCOUNTER — Telehealth: Payer: Self-pay | Admitting: *Deleted

## 2010-03-13 MED ORDER — IPRATROPIUM-ALBUTEROL 18-103 MCG/ACT IN AERO: 2.0000 | INHALATION_SPRAY | Freq: Four times a day (QID) | RESPIRATORY_TRACT | Status: DC | PRN

## 2010-03-13 NOTE — Telephone Encounter (Signed)
Refill approved - nurse to call in. 

## 2010-03-13 NOTE — Telephone Encounter (Signed)
Last refill 11/28/09

## 2010-03-14 NOTE — Telephone Encounter (Signed)
Rx faxed in.

## 2010-03-15 NOTE — Letter (Signed)
Summary: BLOOD GLUCOSE  BLOOD GLUCOSE   Imported By: Margie Billet 02/28/2010 11:14:12  _____________________________________________________________________  External Attachment:    Type:   Image     Comment:   External Document

## 2010-03-22 ENCOUNTER — Other Ambulatory Visit: Payer: Self-pay | Admitting: *Deleted

## 2010-03-22 MED ORDER — ATORVASTATIN CALCIUM 10 MG PO TABS: 10.0000 mg | ORAL_TABLET | Freq: Every day | ORAL | Status: DC

## 2010-03-22 MED ORDER — IPRATROPIUM-ALBUTEROL 18-103 MCG/ACT IN AERO: 4.0000 | INHALATION_SPRAY | Freq: Four times a day (QID) | RESPIRATORY_TRACT | Status: DC | PRN

## 2010-03-23 NOTE — Telephone Encounter (Signed)
Faxed to [facility]. 

## 2010-03-27 ENCOUNTER — Emergency Department (HOSPITAL_COMMUNITY): Payer: Self-pay

## 2010-03-27 ENCOUNTER — Inpatient Hospital Stay (HOSPITAL_COMMUNITY)
Admission: EM | Admit: 2010-03-27 | Discharge: 2010-03-29 | DRG: 192 | Disposition: A | Discharge status: Home / Self Care | Payer: Self-pay | Attending: Internal Medicine | Admitting: Internal Medicine

## 2010-03-27 DIAGNOSIS — K219 Gastro-esophageal reflux disease without esophagitis: Secondary | ICD-10-CM | POA: Diagnosis present

## 2010-03-27 DIAGNOSIS — I10 Essential (primary) hypertension: Secondary | ICD-10-CM | POA: Diagnosis present

## 2010-03-27 DIAGNOSIS — G4733 Obstructive sleep apnea (adult) (pediatric): Secondary | ICD-10-CM | POA: Diagnosis present

## 2010-03-27 DIAGNOSIS — R011 Cardiac murmur, unspecified: Secondary | ICD-10-CM | POA: Diagnosis present

## 2010-03-27 DIAGNOSIS — Z794 Long term (current) use of insulin: Secondary | ICD-10-CM

## 2010-03-27 DIAGNOSIS — K7689 Other specified diseases of liver: Secondary | ICD-10-CM | POA: Diagnosis present

## 2010-03-27 DIAGNOSIS — J329 Chronic sinusitis, unspecified: Secondary | ICD-10-CM | POA: Diagnosis present

## 2010-03-27 DIAGNOSIS — J45901 Unspecified asthma with (acute) exacerbation: Principal | ICD-10-CM | POA: Diagnosis present

## 2010-03-27 DIAGNOSIS — IMO0002 Reserved for concepts with insufficient information to code with codable children: Secondary | ICD-10-CM

## 2010-03-27 DIAGNOSIS — E119 Type 2 diabetes mellitus without complications: Secondary | ICD-10-CM | POA: Diagnosis present

## 2010-03-27 DIAGNOSIS — J441 Chronic obstructive pulmonary disease with (acute) exacerbation: Principal | ICD-10-CM | POA: Diagnosis present

## 2010-03-27 DIAGNOSIS — J45909 Unspecified asthma, uncomplicated: Secondary | ICD-10-CM

## 2010-03-27 DIAGNOSIS — E785 Hyperlipidemia, unspecified: Secondary | ICD-10-CM | POA: Diagnosis present

## 2010-03-27 DIAGNOSIS — Z8249 Family history of ischemic heart disease and other diseases of the circulatory system: Secondary | ICD-10-CM

## 2010-03-27 DIAGNOSIS — E7800 Pure hypercholesterolemia, unspecified: Secondary | ICD-10-CM | POA: Diagnosis present

## 2010-03-27 LAB — DIFFERENTIAL
Basophils Relative: 0 % (ref 0–1)
Eosinophils Relative: 2 % (ref 0–5)
Lymphs Abs: 3.8 10*3/uL (ref 0.7–4.0)
Monocytes Absolute: 0.8 10*3/uL (ref 0.1–1.0)

## 2010-03-27 LAB — POCT I-STAT, CHEM 8
Glucose, Bld: 266 mg/dL — ABNORMAL HIGH (ref 70–99)
HCT: 40 % (ref 36.0–46.0)
Hemoglobin: 13.6 g/dL (ref 12.0–15.0)
Potassium: 4 mEq/L (ref 3.5–5.1)
Sodium: 141 mEq/L (ref 135–145)

## 2010-03-27 LAB — CBC
MCHC: 32.6 g/dL (ref 30.0–36.0)
RDW: 16.1 % — ABNORMAL HIGH (ref 11.5–15.5)

## 2010-03-27 LAB — GLUCOSE, CAPILLARY: Glucose-Capillary: 248 mg/dL — ABNORMAL HIGH (ref 70–99)

## 2010-03-28 ENCOUNTER — Encounter: Payer: Self-pay | Admitting: Internal Medicine

## 2010-03-28 DIAGNOSIS — J45909 Unspecified asthma, uncomplicated: Secondary | ICD-10-CM

## 2010-03-28 LAB — BASIC METABOLIC PANEL
BUN: 14 mg/dL (ref 6–23)
Chloride: 104 mEq/L (ref 96–112)
Creatinine, Ser: 1.19 mg/dL (ref 0.4–1.2)
Glucose, Bld: 450 mg/dL — ABNORMAL HIGH (ref 70–99)

## 2010-03-28 LAB — CBC
HCT: 36.3 % (ref 36.0–46.0)
Hemoglobin: 11.8 g/dL — ABNORMAL LOW (ref 12.0–15.0)
MCH: 24.6 pg — ABNORMAL LOW (ref 26.0–34.0)
MCHC: 32.5 g/dL (ref 30.0–36.0)
MCV: 75.6 fL — ABNORMAL LOW (ref 78.0–100.0)
Platelets: 367 K/uL (ref 150–400)
RBC: 4.8 MIL/uL (ref 3.87–5.11)
RDW: 16.3 % — ABNORMAL HIGH (ref 11.5–15.5)
WBC: 11.6 K/uL — ABNORMAL HIGH (ref 4.0–10.5)

## 2010-03-28 LAB — BASIC METABOLIC PANEL WITH GFR
BUN: 15 mg/dL (ref 6–23)
CO2: 22 meq/L (ref 19–32)
Calcium: 9.2 mg/dL (ref 8.4–10.5)
Chloride: 106 meq/L (ref 96–112)
Creatinine, Ser: 1.25 mg/dL — ABNORMAL HIGH (ref 0.4–1.2)
GFR calc Af Amer: 54 mL/min — ABNORMAL LOW (ref >60)
GFR calc non Af Amer: 44 mL/min — ABNORMAL LOW (ref >60)
Glucose, Bld: 432 mg/dL — ABNORMAL HIGH (ref 70–99)
Potassium: 4.9 meq/L (ref 3.5–5.1)
Sodium: 137 meq/L (ref 135–145)

## 2010-03-28 LAB — CK TOTAL AND CKMB (NOT AT ARMC)
CK, MB: 1.3 ng/mL (ref 0.3–4.0)
Relative Index: 0.4 (ref 0.0–2.5)
Total CK: 303 U/L — ABNORMAL HIGH (ref 7–177)

## 2010-03-28 LAB — GLUCOSE, CAPILLARY
Glucose-Capillary: 436 mg/dL — ABNORMAL HIGH (ref 70–99)
Glucose-Capillary: 428 mg/dL — ABNORMAL HIGH (ref 70–99)
Glucose-Capillary: 468 mg/dL — ABNORMAL HIGH (ref 70–99)
Glucose-Capillary: 305 mg/dL — ABNORMAL HIGH (ref 70–99)
Glucose-Capillary: 314 mg/dL — ABNORMAL HIGH (ref 70–99)

## 2010-03-28 LAB — T4: T4, Total: 7.5 ug/dL (ref 5.0–12.5)

## 2010-03-28 LAB — TSH: TSH: 0.591 u[IU]/mL (ref 0.350–4.500)

## 2010-03-28 NOTE — H&P (Signed)
Hospital Admission Note Date: 03/28/2010  Patient name: Regina Stevenson Medical record number: [id number] Date of birth: [birth year] Age: 55 y.o. Gender: female PCP: Whitney Post, MD PhD  Medical Service: Internal Medicine Teaching Service   Attending physician:  Dr. Josem Kaufmann   First Contact:    Dr. Odis Luster   Pager: [phone] Resident (R2/R3):   Dr. Gilford Rile  Pager: [phone] After Hours:   First Contact: [phone] Second Contact [phone]   Chief Complaint: Cough, shortness of breath, wheezing  History of Present Illness: Patient is a 55yo female with PMHx of COPD, asthma, moderate to severe OSA, DMII who presents to [facility] for evaluation of cough, shortness of breath, and wheezing. Patient states that "a few days ago" she had insidious onset of nonproductive cough with shortness of breath, nasal congestion, sore throat, and sinus pressure/ drainage. Shortness of breath became acutely worse today with increased requirement of her Combivent (using it q4 hours, while previously only required 1-2 times monthly) - which only minimally improved symptoms. Patient otherwise also confirms chills, but no fevers, abdominal pain, nausea, vomiting, or diarrhea. Confirms sick contact, specifically her foster child has been febrile with similar symptoms within the past few days. Patient otherwise taking chronic medications regularly.   Current Outpatient Medications  Medication Sig    . albuterol-ipratropium (COMBIVENT) 18-103 MCG/ACT inhaler Inhale 4 puffs into the lungs 4 (four) times daily as needed for Wheezing.    Marland Kitchen amLODipine (NORVASC) 10 MG tablet Take 10 mg by mouth daily.      Marland Kitchen atorvastatin (LIPITOR) 10 MG tablet Take 1 tablet (10 mg total) by mouth daily.    . insulin regular (HUMULIN R,NOVOLIN R) 100 UNIT/ML injection Inject into the skin 3 (three) times daily before meals. Inject 50 units in the morning and 45 units in the evening       . mometasone (NASONEX) 50 MCG/ACT nasal spray 1 spray by Nasal route  daily.        . pantoprazole (PROTONIX) 40 MG tablet Take 40 mg by mouth daily.        . quinapril (ACCUPRIL) 40 MG tablet Take 40 mg by mouth at bedtime.          Allergies: Codeine - nausea/ vomiting Oxycodone-acetaminophen - itching  Past Medical History  Diagnosis Date  . Diabetic gastroparesis associated with type 2 diabetes mellitus   . Diabetes mellitus     HgA1c 8.1 in 12/2009  . Hyperlipidemia   . Hypertension   . Allergic rhinitis   . Obstructive sleep apnea     Moderately severe obstructive sleep apnea/hypopnea syndrome per Sleep study 04/2009. Patient declined CPAP use (although recommended) secondary to claustrophobia.  . Fatty liver disease, nonalcoholic   . Obesity   . Cholelithiasis   . COPD   . GERD      Past Surgical History  Procedure Date  . Abdominal hysterectomy 05/2001    Supracervical abdominal hysterectomy with lysis    Family History  Problem Relation Age of Onset  . Coronary artery disease Mother     MI age 78yo, deceased  . Coronary artery disease Father     MI age 13s, deceased  . Diabetes type II    . Hypertension    . Breast cancer      History   Social History Narrative   Nonsmoker, no drug use, no alcohol use. Divorced. 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  Vitals: T: 97.3, P 100, RR 20, BP 157/69, O2 sat 100% on RA  Physical Exam: General appearance: alert, cooperative and no distress, not using accessory muscles for respiration. Head: Normocephalic, without obvious abnormality, atraumatic Eyes: conjunctivae/corneas clear. PERRL, EOM's intact. Fundi benign. Nose: green and yellow discharge, moderate congestion, sinus tenderness bilateral, maxillary and frontal sinus tenderness, greater over maxillary sinuses Throat: abnormal findings: mild oropharyngeal erythema and prior tonsillectomy Neck: mild anterior cervical adenopathy, no JVD, supple, symmetrical, trachea  midline and cervical lymphadenopathy with overlying tenderness to palpation. Lungs: wheezes diffuse inspiratory and expiratory wheezing. Heart: tachycardic, regular rhythm. No murmurs appreciated. Abdomen: soft, non-tender; bowel sounds normal; no masses,  no organomegaly Extremities: extremities normal, atraumatic, no cyanosis or edema Pulses: 2+ and symmetric   Lab results: CBC:  WBC                                      13.0       h      4.0-10.5         K/uL  RBC                                      4.90              3.87-5.11        MIL/uL  Hemoglobin (HGB)                         11.8       l      12.0-15.0        g/dL  Hematocrit (HCT)                         36.2              36.0-46.0        %  MCV                                      73.9       l      78.0-100.0       fL  MCH -                                    24.1       l      26.0-34.0        pg  MCHC                                     32.6              30.0-36.0        g/dL  RDW                                      16.1       h      11.5-15.5        %  Platelet Count (PLT)                     365               150-400          K/uL  Neutrophils, %                           63                43-77            %  Lymphocytes, %                           29                12-46            %  Monocytes, %                             6                 3-12             %  Eosinophils, %                           2                 0-5              %  Basophils, %                             0                 0-1              %  Neutrophils, Absolute                    8.1        h      1.7-7.7          K/uL  Lymphocytes, Absolute                    3.8               0.7-4.0          K/uL  Monocytes, Absolute                      0.8               0.1-1.0          K/uL  Eosinophils, Absolute                    0.3               0.0-0.7          K/uL  Basophils, Absolute                      0.0               0.0-0.1           K/uL  ISTAT Chem8  TCO2  24                0-100            mmol/L  Ionized Calcium                          1.12              1.12-1.32        mmol/L  Hemoglobin (HGB)                         13.6              12.0-15.0        g/dL  Hematocrit (HCT)                         40.0              36.0-46.0        %  Sodium (NA)                              141               135-145          mEq/L  Potassium (K)                            4.0               3.5-5.1          mEq/L  Chloride                                 105               96-112           mEq/L  Glucose                                  266        h      70-99            mg/dL  BUN                                      16                6-23             mg/dL  Creatinine                               1.3        h      0.4-1.2          Mg/dL  Cardiac Enzymes  Creatine Kinase, Total                   303        h      7-177            U/L  CK, MB  1.3               0.3-4.0          ng/mL  Relative Index                           0.4               0.0-2.5  Troponin I                               <0.01             0.00-0.06        ng/mL  Imaging results:  CXR (2 View) -   1.  Chronic coarse perihilar interstitial markings.  No acute disease.  Other results: EKG - normal sinus rhythm, rate 95 bpm, normal axis, nonspecific T wave abnormalities.   Assessment & Plan by Problem: 1) Shortness of breath - Likely acute COPD exacerbation precipitated by viral respiratory infection versus sinus infection particularly in context of sick contact with similar symptoms. Symptoms are complicated by long-standing untreated moderate-to-severe obstructive sleep apnea. Patient with normal systolic and diastolic dysfunction (per 2-D echocardiogram 04/2009) and no clinical evidence of heart failure for cause of acute exacerbation. CXR negative for acute cardiopulmonary processes. With  history of diabetes mellitus, HTN, HLD, family history of CAD, cardiac source of shortness of breath will be evaluated with EKG revealing no acute ST/T changes and CE negative x 1.  - Will provide scheduled and PRN bronchodilators - Solumedrol IV - Nasonex for nasal congestion - Gentle fluid resuscitation - Avelox (moxifloxacin) empiric treatment  2) DMII - last HgA1c 8.1 in 12/2009.  - Continue on home insulin, with consideration for additional SSI as indicated for steroid-induced hyperglycemia  3) HTN - blood pressures mildy elevated in setting of mild respiratory distress. - Continue home medications.  4) HLD  - per record review, lipids optimally controlled on current regimen with LDL <70 (02/2010). - Continue home medications.  5) GERD - Continue home medications.  6) DVT PPX: Lovenox   I have seen and examined the patient. I reviewed the resident/fellow note and agree with the findings and plan of care as documented. My additions and revisions are included.   Signature:   ____________________________________________    Internal medicine Teaching Service Attending  Date:   ____________________________________________

## 2010-03-29 ENCOUNTER — Other Ambulatory Visit: Payer: Self-pay | Admitting: *Deleted

## 2010-03-29 DIAGNOSIS — R0989 Other specified symptoms and signs involving the circulatory and respiratory systems: Secondary | ICD-10-CM

## 2010-03-29 DIAGNOSIS — R0609 Other forms of dyspnea: Secondary | ICD-10-CM

## 2010-03-29 LAB — BASIC METABOLIC PANEL
CO2: 23 mEq/L (ref 19–32)
Calcium: 9.7 mg/dL (ref 8.4–10.5)
Chloride: 104 mEq/L (ref 96–112)
GFR calc Af Amer: 53 mL/min — ABNORMAL LOW (ref >60)
Sodium: 140 mEq/L (ref 135–145)

## 2010-03-29 LAB — CBC
Hemoglobin: 11.8 g/dL — ABNORMAL LOW (ref 12.0–15.0)
Platelets: 391 10*3/uL (ref 150–400)
RBC: 4.85 MIL/uL (ref 3.87–5.11)
WBC: 18.2 10*3/uL — ABNORMAL HIGH (ref 4.0–10.5)

## 2010-03-29 LAB — GLUCOSE, CAPILLARY
Glucose-Capillary: 278 mg/dL — ABNORMAL HIGH (ref 70–99)
Glucose-Capillary: 319 mg/dL — ABNORMAL HIGH (ref 70–99)

## 2010-03-29 MED ORDER — MOMETASONE FUROATE 50 MCG/ACT NA SUSP: 1.0000 | Freq: Every day | NASAL | Status: DC

## 2010-03-31 ENCOUNTER — Other Ambulatory Visit: Payer: Self-pay

## 2010-03-31 DIAGNOSIS — Z1231 Encounter for screening mammogram for malignant neoplasm of breast: Secondary | ICD-10-CM

## 2010-03-31 NOTE — Telephone Encounter (Signed)
Called to pharm 

## 2010-04-10 NOTE — Discharge Summary (Signed)
NAME:  Regina Stevenson, FREUND NO.:  [id number]  MEDICAL RECORD NO.:  [id number]           PATIENT TYPE:  I  LOCATION:  4741                         FACILITY:  [facility]  PHYSICIAN:  Doneen Poisson, MD     DATE OF BIRTH:  1955-07-12  DATE OF ADMISSION:  03/27/2010 DATE OF DISCHARGE:  03/29/2010                              DISCHARGE SUMMARY   DISCHARGE DIAGNOSES: 1. Asthma exacerbation. 2. Sinusitis. 3. Diabetes. 4. New heart murmur. 5. Hypertension. 6. Hyperlipidemia. 7. Allergic rhinitis. 8. Obstructive sleep apnea, not on CPAP. 9. Nonalcoholic fatty liver disease. 10.Morbid obesity. 11.Gastroesophageal reflux disease.  DISCHARGE MEDICATIONS: 1. Amoxicillin 500 mg by mouth 3 times a day for 10 days. 2. Amlodipine 10 mg by mouth daily. 3. Prednisone 20 mg tablet take 2 tablets daily for 11 days, then     stop. 4. Insulin NPH, use 45 units every morning and 50 units every evening     subcutaneously. 5. Accupril 40 mg by mouth daily. 6. Combivent 2 puffs inhaled every 4 hours as needed for shortness of     breath. 7. Ibuprofen 400 mg by mouth every 8 hours as needed for pain. 8. Lipitor 10 mg by mouth daily. 9. Nasonex 2 sprays nasally daily. 10.Protonix 40 mg by mouth daily.  DISPOSITION AND FOLLOWUP:  Regina Stevenson was discharged from the [facility] on March 29, 2010, in stable and improved condition.  She is to follow up with Dr. Saralyn Pilar in the [facility] on March 19 at 8:15AM.  At that time the echocardiogram results, inhaler use, and any further palpitations should  be reviewed.    CONSULTATIONS:  None.  PROCEDURES PERFORMED:  Imaging; 2-D echocardiogram done on March 29, 2010, results pending.  Two-view chest x-ray March 27, 2010, showing chronic course perihilar interstitial markings with no acute disease.  ADMISSION HISTORY:  Regina Stevenson is a 55 year old woman with a past medical history of asthma,  moderate to severe obstructive sleep apnea, and diabetes who presents to [facility] for evaluation of cough, shortness of breath and wheezing.  She states that several days ago she had insidious onset of nonproductive cough with shortness of breath, nasal congestion, sore throat and sinus pressure/drainage.  Her shortness of breath became acutely worse today with increased requirement of her Combivent inhaler using it every 4 hours where previously she only required 1-2 times a month.  This only minimally improved her symptoms.  She also confirmed chills but no fevers, abdominal pain, nausea, vomiting or diarrhea.  She confirms a sick contact in her foster child who has been febrile with similar symptoms within the past few days.  She otherwise reports taking her medicines as instructed.  Of note, she is a nonsmoker and had pulmonary function test showing a mixed obstructive/restrictive lung disease pattern, primarily restrictive in March 2011.    Admission vitals; Temperature 97.3, pulse 100, respiratory rate 20, blood pressure 157/69, O2 sat 100% on room air.  PHYSICAL EXAMINATION: GENERAL APPEARANCE:  Alert, cooperative in no distress, not using  accessory muscles for respiration. HEENT:  Normocephalic without obvious abnormality,  atraumatic.   Conjunctiva and corneas were clear.  Pupils equal, round and reactive  to light.  Extraocular movements intact.  Fundi benign.  Nose with  green and yellow discharge with moderate congestion and sinus tenderness.   Bilateral maxillary and frontal sinus tenderness greater over the  maxillary sinuses.  Mild oropharyngeal erythema, no evidence of prior  tonsillectomy. NECK:  Mild anterior cervical adenopathy.  No JVD.  Supple, symmetrical.  Trachea midline. LUNGS:  Wheezes diffusely on inspiration and expiration. HEART:  Tachycardic, regular rate and rhythm.  No murmurs appreciated. ABDOMEN:  Soft, nontender.  Bowel sounds normal.  No masses  or organomegaly. EXTREMITIES:  Normal, atraumatic.  No cyanosis or edema.  Pulses are 2+ and symmetric.  ADMISSION LABORATORY FINDINGS:  White blood cell count 13, hemoglobin  11.8, hematocrit 36.2, platelets 365, neutrophils 63%. I-STAT chemistry: Sodium 141, potassium 4.0, chloride 105, bicarb 24, BUN 16, creatinine  1.3, glucose 266.  CK 303, CK-MB 1.3 troponin less than 0.01.  HOSPITAL COURSE BY PROBLEM: 1. Asthma exacerbation.  Ms. Lipps presented with an asthma     exacerbation in the setting of a sinus infection with a     recent sick contact with probable viral respiratory infection.  Her     symptoms are likely complicated by longstanding, untreated moderate     to severe obstructive sleep apnea.  She was given initially     IV steroids and transitioned to orals for a total of 14-day     course.  She also was given antibiotic treatment for her inciting     event, sinusitis.  Nebulizers were scheduled initially     and then were changed to p.r.n. as she was doing very well with no      wheezing on the day of discharge.  2. Sinusitis.  Ms. Bickley presented with mildly elevated white blood     cell count, was afebrile and had a physical examination consistent      with acute sinusitis.  She was initially treated with Avelox but then     transitioned to amoxicillin and discharged to complete a total of a     10-day course.  3. Diabetes.  Ms. Mateus has suboptimally controlled diabetes with     the last hemoglobin A1c of 8.1% in November 2011.  This was     rechecked and found to be 8.5%.  During the hospitalization she had     a problem with hyperglycemia likely related to steroids.  She was     covered with sliding scale insulin.  Her home insulin regimen was     not modified on discharge as it was thought she would return to      baseline after completing her steroids.  However, given her     elevated hemoglobin A1c 8.5%, even in the setting of acute infection,     she may  still need titration up of her insulin dosing.  4. Hypertension.  Ms. Gowen's blood pressures were initially mildly     elevated in the setting of her mild respiratory distress.  However     by the day of discharge, she was at goal and her home medicines     were not modified.  5. Heart murmur.  On the second day of admission, it was noted that     She had a new heart murmur that was systolic without     radiation heard best at the left upper sternal border.  She  denied any chest pain.  Cardiac enzymes were negative on     admission and she did not show any events on telemetry.  She does     report palpitations and sweats with exertion off and on for the     past year.  She had a prior echocardiogram in March 2011 which     showed a systolic ejection fraction of 16-10% with no regional wall     motion abnormalities and normal diastolic function parameters.     This was reassuring, however because of the new onset of the murmur     that was not present in prior documentation it was thought     prudent to repeat the echocardiogram.  She was stable and      appropriate that she be discharged prior to the echo being read      officially.  At follow up, it will be important to review her echo.  DISCHARGE VITAL SIGNS:  Temperature 97.2, pulse 85, blood pressure 118/66, respiratory rate 19, O2 sat 97% on room air.  DISCHARGE LABORATORY FINDINGS:  Sodium 140, potassium 4.6, chloride 104, bicarb 23, BUN 19, creatinine 1.26, glucose 278, calcium 9.7.  White blood cell count 18.2, hemoglobin 11.8, hematocrit 36.2, platelets 391, TSH 0.591, T4 7.5, hemoglobin A1c 8.5%   ______________________________ Danelle Berry, MD   ______________________________ Doneen Poisson, MD   JW/MEDQ  D:  03/29/2010  T:  03/30/2010  Job:  [id number]  cc:   Johnette Abraham, DO  Electronically Signed by Danelle Berry MD on 03/31/2010 09:81:19 PM Electronically Signed by Doneen Poisson  on 04/10/2010  11:54:36 AM

## 2010-04-19 LAB — GLUCOSE, CAPILLARY: Glucose-Capillary: 223 mg/dL — ABNORMAL HIGH (ref 70–99)

## 2010-04-24 ENCOUNTER — Ambulatory Visit (INDEPENDENT_AMBULATORY_CARE_PROVIDER_SITE_OTHER): Payer: Self-pay | Admitting: Internal Medicine

## 2010-04-24 ENCOUNTER — Encounter: Payer: Self-pay | Admitting: Internal Medicine

## 2010-04-24 ENCOUNTER — Other Ambulatory Visit: Payer: Self-pay | Admitting: Internal Medicine

## 2010-04-24 VITALS — BP 135/77 | HR 103 | Temp 97.4°F | Ht 67.0 in | Wt 280.2 lb

## 2010-04-24 DIAGNOSIS — Z09 Encounter for follow-up examination after completed treatment for conditions other than malignant neoplasm: Secondary | ICD-10-CM

## 2010-04-24 DIAGNOSIS — E1149 Type 2 diabetes mellitus with other diabetic neurological complication: Secondary | ICD-10-CM

## 2010-04-24 DIAGNOSIS — Z0000 Encounter for general adult medical examination without abnormal findings: Secondary | ICD-10-CM | POA: Insufficient documentation

## 2010-04-24 DIAGNOSIS — I10 Essential (primary) hypertension: Secondary | ICD-10-CM

## 2010-04-24 DIAGNOSIS — J3489 Other specified disorders of nose and nasal sinuses: Secondary | ICD-10-CM

## 2010-04-24 NOTE — Assessment & Plan Note (Signed)
-   Will need PAP next continuity visit.

## 2010-04-24 NOTE — Assessment & Plan Note (Signed)
Pt was hospitalized for acute asthma exacerbation. At this time, symptoms well controlled, no evidence of acute exacerbation. Echocardiogram results reviewed with pt. - Continue current asthma regimen.

## 2010-04-24 NOTE — Assessment & Plan Note (Addendum)
Well controlled now that steroids have been discontinued. Continue current regimen.  - Check urine microalb/ cr today.

## 2010-04-24 NOTE — Progress Notes (Signed)
Subjective:    Patient ID: Regina Stevenson, female    DOB: [birth year], 55 y.o.   MRN: [id number]  HPI Pt is a 55yo female with PMHX of DMII, HTN, HLD, OSA who presents to clinic for the following:  1) HFU - Patient was hospitalized at [facility] from 02/20-02/22/12 for evaluation of worsening cough, shortness of breath, and wheezing, which was determined to be an asthma exacerbation in the setting of sinus infection and recent sick contact with viral illness. Was discharged with prednisone x 11 days and 10 day course of Amoxicillin. During hospital course, pt also noted to have a systolic murmur for which Echocardiogram was ordered was pending at time of discharge. Echart review shows that aside from mild aortic valve calcification, no other abn found.   Since hospital discharge, patient notes significant improvement of cough, wheezing, shortness of breath and has completed all prescribed medications as instructed. No fevers, chills, nausea, vomiting, diarrhea. However, pt continues to have sinus pressure, post nasal drip. Also with frontal headaches that extend behind eyes, pounding in nature, taking OTC sinus medication, which resolve the pain.  2) DMII - Patient checking blood sugars 2 times daily, before breakfast and before/ or after dinner. Reports blood sugars of 100 - 110 mg/dL. Currently taking Humulin 45 units in AM, 50 units in PM. 0 hypoglycemic episodes since last visit. Prior hypoglycemic episodes were symptomatic with jitteriness and fatigue.   3) HTN - Patient does not check blood pressure regularly at home. Currently taking Amlodipine and Accupril. Denies dizziness, lightheadedness, chest pain, shortness of breath.   4) Preventative Health Care - UTD on mammogram, colonoscopy, eye exam, foot exam. Needs microalb/ cr, PAP.   Review of Systems Per HPI.  Current Outpatient Medications Medication Sig  . albuterol-ipratropium (COMBIVENT) 18-103 MCG/ACT inhaler Inhale 4 puffs into the lungs  4 (four) times daily as needed for Wheezing.  Marland Kitchen amLODipine (NORVASC) 10 MG tablet Take 10 mg by mouth daily.    Marland Kitchen atorvastatin (LIPITOR) 10 MG tablet Take 1 tablet (10 mg total) by mouth daily.  . insulin regular (HUMULIN R,NOVOLIN R) 100 UNIT/ML injection Inject into the skin 3 (three) times daily before meals. Inject 45 units in the morning and 50 units in the evening  . mometasone (NASONEX) 50 MCG/ACT nasal spray 1 spray by Nasal route daily.  . pantoprazole (PROTONIX) 40 MG tablet Take 40 mg by mouth daily.    . quinapril (ACCUPRIL) 40 MG tablet Take 40 mg by mouth at bedtime.      Allergies Codeine and Oxycodone-acetaminophen  Past Medical History  Diagnosis Date  . Diabetic gastroparesis associated with type 2 diabetes mellitus   . Diabetes mellitus     HgA1c 8.5 in 03/2010  . Hyperlipidemia   . Hypertension   . Allergic rhinitis   . Obstructive sleep apnea     Moderately severe obstructive sleep apnea/hypopnea syndrome per Sleep study 04/2009. Patient declined CPAP use (although recommended) secondary to claustrophobia.  . Fatty liver disease, nonalcoholic   . Obesity   . Cholelithiasis     Past Surgical History  Procedure Date  . Abdominal hysterectomy 05/2001    Supracervical abdominal hysterectomy with lysis     Objective:   Physical Exam General: Vital signs reviewed and noted. Well-developed,well-nourished,in no acute distress; alert,appropriate and cooperative throughout examination. Head: normocephalic, atraumatic. Tenderness to palpation over maxillary and frontal sinuses.  Ears: TM nonerythematous, not bulging, good light reflex bilaterally. Nose: Mucous membranes inflamed, nonerythematous, mucousy discharge. Throat:  Oropharynx nonerythematous, no exudate appreciated.  Neck: No deformities, masses, or tenderness noted. Lungs: Normal respiratory effort. Clear to auscultation BL without crackles or wheezes.  Heart: RRR. S1 and S2 normal without gallop, murmur,  or rubs.  Abdomen: BS normoactive. Soft, Nondistended, non-tender.  No masses or organomegaly. Extremities: No pretibial edema.         Assessment & Plan:

## 2010-04-24 NOTE — Assessment & Plan Note (Addendum)
No evidence of acute infection at this time, as pt remains afebrile, without purulent nasal discharge.  - Continue OTC tylenol and sinus meds prn  - Will change nasonex to 1 spray twice daily, if not helpful, can use 2 sprays twice daily x 1 week for nasal congestion - however, counseled not to extend increased therapy. - Close follow-up, pt to return sooner if symptoms worsen, or develop fevers. If symptoms not improved, consider referral back to ENT to evaluate as pt has hx of sinus disease.

## 2010-04-24 NOTE — Assessment & Plan Note (Signed)
Well-controlled.  Continue current regimen. 

## 2010-04-24 NOTE — Patient Instructions (Signed)
1) Please follow-up at the clinic in 3-4 weeks, at which time we will reevaluate your sinus pressure, headaches, diabetes, blood pressure. 2) You have been increased on your Nasonex, if you develop throat closing, tongue swelling, rash, please stop the medication and call the clinic at [phone] and go to the ER. 3) Please bring all of your medications in a bag to your next visit, please bring your meter to your next visit.

## 2010-04-25 LAB — MICROALBUMIN / CREATININE URINE RATIO
Creatinine, Urine: 94.4 mg/dL
Microalb, Ur: 1.06 mg/dL (ref 0.00–1.89)

## 2010-04-28 ENCOUNTER — Other Ambulatory Visit: Payer: Self-pay | Admitting: Family Medicine

## 2010-04-28 DIAGNOSIS — Z1231 Encounter for screening mammogram for malignant neoplasm of breast: Secondary | ICD-10-CM

## 2010-05-01 LAB — BASIC METABOLIC PANEL
BUN: 11 mg/dL (ref 6–23)
BUN: 12 mg/dL (ref 6–23)
Calcium: 8.9 mg/dL (ref 8.4–10.5)
Calcium: 9.1 mg/dL (ref 8.4–10.5)
Chloride: 104 mEq/L (ref 96–112)
Creatinine, Ser: 1.03 mg/dL (ref 0.4–1.2)
GFR calc Af Amer: >60 mL/min (ref >60)
GFR calc non Af Amer: 48 mL/min — ABNORMAL LOW (ref >60)
GFR calc non Af Amer: 56 mL/min — ABNORMAL LOW (ref >60)
Potassium: 4.1 mEq/L (ref 3.5–5.1)
Sodium: 139 mEq/L (ref 135–145)

## 2010-05-01 LAB — CBC
HCT: 35.9 % — ABNORMAL LOW (ref 36.0–46.0)
MCHC: 32.1 g/dL (ref 30.0–36.0)
Platelets: 306 10*3/uL (ref 150–400)
Platelets: 320 10*3/uL (ref 150–400)
RBC: 4.36 MIL/uL (ref 3.87–5.11)
RDW: 16.3 % — ABNORMAL HIGH (ref 11.5–15.5)
WBC: 10.2 10*3/uL (ref 4.0–10.5)

## 2010-05-01 LAB — GLUCOSE, CAPILLARY
Glucose-Capillary: 100 mg/dL — ABNORMAL HIGH (ref 70–99)
Glucose-Capillary: 104 mg/dL — ABNORMAL HIGH (ref 70–99)
Glucose-Capillary: 111 mg/dL — ABNORMAL HIGH (ref 70–99)
Glucose-Capillary: 142 mg/dL — ABNORMAL HIGH (ref 70–99)
Glucose-Capillary: 164 mg/dL — ABNORMAL HIGH (ref 70–99)
Glucose-Capillary: 188 mg/dL — ABNORMAL HIGH (ref 70–99)
Glucose-Capillary: 60 mg/dL — ABNORMAL LOW (ref 70–99)
Glucose-Capillary: 63 mg/dL — ABNORMAL LOW (ref 70–99)
Glucose-Capillary: 71 mg/dL (ref 70–99)
Glucose-Capillary: 88 mg/dL (ref 70–99)

## 2010-05-01 LAB — RAPID URINE DRUG SCREEN, HOSP PERFORMED
Barbiturates: NOT DETECTED
Opiates: NOT DETECTED

## 2010-05-01 LAB — CARDIAC PANEL(CRET KIN+CKTOT+MB+TROPI)
CK, MB: 0.8 ng/mL (ref 0.3–4.0)
Relative Index: 0.5 (ref 0.0–2.5)
Relative Index: 0.5 (ref 0.0–2.5)
Troponin I: 0.01 ng/mL (ref 0.00–0.06)

## 2010-05-01 LAB — LIPID PANEL
Triglycerides: 84 mg/dL (ref <150)
VLDL: 17 mg/dL (ref 0–40)

## 2010-05-01 LAB — SODIUM, URINE, RANDOM: Sodium, Ur: 99 mEq/L

## 2010-05-02 ENCOUNTER — Ambulatory Visit (HOSPITAL_COMMUNITY)
Admission: RE | Admit: 2010-05-02 | Discharge: 2010-05-02 | Disposition: A | Discharge status: Home / Self Care | Payer: Self-pay | Source: Ambulatory Visit | Attending: *Deleted | Admitting: *Deleted

## 2010-05-02 DIAGNOSIS — Z1231 Encounter for screening mammogram for malignant neoplasm of breast: Secondary | ICD-10-CM | POA: Insufficient documentation

## 2010-05-08 ENCOUNTER — Telehealth: Payer: Self-pay | Admitting: *Deleted

## 2010-05-08 NOTE — Telephone Encounter (Addendum)
Pt is requesting a refill on Clarinex 5 mg tablets.  Was discontinued 12/2009.

## 2010-05-09 ENCOUNTER — Other Ambulatory Visit: Payer: Self-pay | Admitting: *Deleted

## 2010-05-09 ENCOUNTER — Other Ambulatory Visit: Payer: Self-pay | Admitting: Internal Medicine

## 2010-05-09 MED ORDER — DESLORATADINE 5 MG PO TABS: 5.0000 mg | ORAL_TABLET | Freq: Every day | ORAL | Status: DC

## 2010-05-09 NOTE — Telephone Encounter (Signed)
I called the patient to make sure that she was not taking or planning to take more than one allergy med at the same time (I had prescribed fexofenadine in 12/2009). She is not taking anything and the choice of Clarinex I think has to do with cost and availability through the county med assistance program. I will put in the refill. Thanks!

## 2010-05-10 MED ORDER — QUINAPRIL HCL 40 MG PO TABS: 40.0000 mg | ORAL_TABLET | Freq: Every day | ORAL | Status: DC

## 2010-05-10 NOTE — Telephone Encounter (Signed)
Faxed to [facility]

## 2010-05-11 LAB — GLUCOSE, CAPILLARY: Glucose-Capillary: 139 mg/dL — ABNORMAL HIGH (ref 70–99)

## 2010-05-17 LAB — GLUCOSE, CAPILLARY: Glucose-Capillary: 364 mg/dL — ABNORMAL HIGH (ref 70–99)

## 2010-05-19 ENCOUNTER — Encounter: Payer: Self-pay | Admitting: Internal Medicine

## 2010-05-22 LAB — GLUCOSE, CAPILLARY: Glucose-Capillary: 151 mg/dL — ABNORMAL HIGH (ref 70–99)

## 2010-06-02 ENCOUNTER — Other Ambulatory Visit: Payer: Self-pay | Admitting: *Deleted

## 2010-06-03 ENCOUNTER — Inpatient Hospital Stay (INDEPENDENT_AMBULATORY_CARE_PROVIDER_SITE_OTHER)
Admission: RE | Admit: 2010-06-03 | Discharge: 2010-06-03 | Disposition: A | Discharge status: Home / Self Care | Payer: Self-pay | Source: Ambulatory Visit | Attending: Family Medicine | Admitting: Family Medicine

## 2010-06-03 DIAGNOSIS — F411 Generalized anxiety disorder: Secondary | ICD-10-CM

## 2010-06-07 MED ORDER — IPRATROPIUM-ALBUTEROL 18-103 MCG/ACT IN AERO: 4.0000 | INHALATION_SPRAY | Freq: Four times a day (QID) | RESPIRATORY_TRACT | Status: DC | PRN

## 2010-06-07 NOTE — Telephone Encounter (Signed)
Rx faxed in.

## 2010-06-13 ENCOUNTER — Other Ambulatory Visit: Payer: Self-pay | Admitting: *Deleted

## 2010-06-13 NOTE — Telephone Encounter (Signed)
Please check the above Rx I entered into chart.  I called pt and she states she takes the humulin N pen no the regular that is in EPIC. It may have been entered wrong.   I did not remove the first entry so you can see what was in the record.  Request is for the NPH.

## 2010-06-14 MED ORDER — INSULIN NPH (HUMAN) (ISOPHANE) 100 UNIT/ML ~~LOC~~ SUSP: SUBCUTANEOUS | Status: DC

## 2010-06-14 NOTE — Telephone Encounter (Signed)
The is no option other than mL - it is hardwired into EPIC and cannot be changed to vials / pens / boxes.

## 2010-06-14 NOTE — Telephone Encounter (Signed)
Rx faxed in.

## 2010-06-14 NOTE — Telephone Encounter (Signed)
Dr Odis Luster, this is great but you need to order the amount of pens needed for each month.  It's does not come in ml. Also [facility] likes to give 3 month supply. 5 pens in 1 box, each pen has 3ml or 300 units.

## 2010-06-23 NOTE — Procedures (Signed)
NAME:  Regina Stevenson, KAZANJIAN NO.:  [id number]   MEDICAL RECORD NO.:  [id number]          PATIENT TYPE:  OUT   LOCATION:  SLEEP CENTER                 FACILITY[facility]  [facility]   PHYSICIAN:  Clinton D. Maple Hudson, M.D. DATE OF BIRTH:  [birth year]   DATE OF STUDY:  11/19/2003                              NOCTURNAL POLYSOMNOGRAM   REFERRING PHYSICIAN:  Lucky Cowboy, M.D.   INDICATION FOR STUDY:  Hypersomnia with sleep apnea.  Prior NPSG July 23, 2002 had documented obstructive sleep apnea/hypopnea syndrome, RDI 51/hr.  She had not tolerated CPAP trial for split protocol at that time.  Now  returns for postoperative evaluation with NPSG requested.   SLEEP ARCHITECTURE:  Total sleep time 291 minutes with sleep efficiency 63%,  stage I was 12%, stage II 87%, stages III and IV, and REM were absent.  Latency to sleep onset 89 minutes, awake after sleep onset 57 minutes,  arousal index 29.  Most arousals appear to have been spontaneous, not  clearly related to respiratory events or leg jerks.   RESPIRATORY DATA:  NPSG protocol.  RDI 22.6/hr reflecting 28 obstructive  apneas and 82 hypopneas.  Most sleep and most events were while supine.   OXYGEN DATA:  Mild snoring with oxygen desaturation to a nadir of 89%.  Mean  oxygen saturation through the study was 95% on room air.   CARDIAC DATA:  Normal sinus rhythm.   MOVEMENT/PARASOMNIA:  A total of 42 limb jerks were recorded of which 11  were associated with arousal or awakening for a periodic limb movement with  arousal index of 2.3/hr which is mildly increased.   IMPRESSION/RECOMMENDATION:  Moderate obstructive sleep apnea/hypopnea  syndrome, respiratory disturbance index 22.6/hr with desaturation to 89%.  This reflects a substantial improvement from baseline respiratory  disturbance index of 51/hr on July 23, 2002.  Mild periodic limb movement  syndrome with arousal, 2.3/hr.     Joni Fears D. Maple Hudson, M.D.  Diplomate, [employer] Board   CDY/MEDQ  D:  11/28/2003 09:23:32  T:  11/28/2003 19:46:36  Job:  [id number]

## 2010-06-23 NOTE — H&P (Signed)
[facility]  Patient:    Regina Stevenson, Regina Stevenson Visit Number: [id number] MRN: [id number]          Service Type: EMS Location: [place] [place] Attending Physician:  Pearletha Alfred Dictated by:   Clement Husbands, M.D. Admit Date:  05/10/2001 Discharge Date: 05/10/2001   CC:         Dr. Dayton Martes at the OB/GYN Teaching Office at [facility]  [name] in the OB/GYN Teaching office   History and Physical  HISTORY OF PRESENT ILLNESS:   This 55 year old black female gravida 0 with a three to five-year history of abnormal uterine bleeding and uterine fibroids was seen Jun 06, 2001 for evaluation of bleeding.  For the past year or so she has been on daily Prempro with bleeding about every two weeks.  Bleeding would last for at least a week and started on April 2, April 18, and April 28.  A pelvic ultrasound was done April 17 which showed the uterus to be somewhat larger than it had been the year before.  She had multiple uterine fibroids. The right fibroid measured 2.5 x 3.3 x 2.5 cm.  There was a fundal fibroid measuring 3.6 x 3.6 x 3.7.  There is a posterior fibroid measuring 2.6 x 2.3 x 2.4 cm.  There is also calcification within the myometrial wall.  The ovaries were described as normal.  When she was seen, examination is as dictated below.  An endometrial biopsy was performed.  The uterus sounded about 10 cm. Pathology on the endometrial biopsy revealed proliferative type endometrium with breakdown.  CBC was done which revealed a hematocrit of 42.3, hemoglobin 13.9.  PAST MEDICAL HISTORY:  OPERATIONS:                   1. Surgery on her foot.                               2. Diagnostic laparoscopy years ago which                                  revealed that her "tubes were blocked."  MEDICATIONS:                  NPH insulin.  ALLERGIES:                    CODEINE and PERCOCET which causes her both nausea and itching.  MEDICAL:                      Her  main medical problem is that of diabetes. She was on Humulin insulin 25 h.s. and 35 a.m.  This, however, is not well controlled.  She was seen in the medical clinic for clearance for surgery. She was cleared for elective hysterectomy and the only risk factor being her diabetes.  They recommended pre and intraoperative beta blocker (atenolol) 10 mg IV 30 minutes prior to surgery and repeat postoperatively for blood pressure greater than 110/70 or heart rate greater than 60.  Lab work obtained, however, showed a hemoglobin A1c of 13.2 and a blood glucose of 308. Because her diabetes is so not well controlled she will be rescheduled to be in the medical clinic and I will wait until her diabetes is brought under control before scheduling her.  PHYSICAL EXAMINATION TODAY:  BREAST:                       Normal.  ABDOMEN:                      Obese with a fair-sized panniculus.  Uterus is not palpable through this thick abdomen.  She weighs 240-some pounds.  PELVIC:                       External genitalia, BUS glands are normal, vaginal vault epithelialized.  The cervix is high up in the vagina and deviated to the right side.  Her Pap smear recently was within normal limits. The uterus is hard to feel and is at least eight weeks size, perhaps larger. Specific fibroids are also nonpalpable because of the size of her abdomen. Her ovaries are not felt and no separate masses are felt.  DIAGNOSES:                    1. Uterine fibroids.                               2. Postmenopausal bleeding.                               3. Insulin-dependent diabetes.  DISPOSITION:                  1. She will continue to be followed in the                                  medical clinic until her diabetes is brought                                  under control.                               2. At that time she will be scheduled for                                  abdominal hysterectomy.  I discussed  with her the possibility of having her ovaries removed and she is reluctant to have this done.  She was given literature on hysterectomy and on fibroids.  She was given prescription for Motrin 600 mg to take p.r.n. for abdominal discomfort. Dictated by:   Clement Husbands, M.D. Attending Physician:  Susy Manor B DD:  06/19/01 TD:  06/19/01 Job: [id number]

## 2010-06-23 NOTE — Consult Note (Signed)
[place]  Patient:    Regina Stevenson, Regina Stevenson Visit Number: [id number] MRN: [id number]          Service Type: EMS Location: [facility] [place] Attending Physician:  Pearletha Alfred Dictated by:   Elinor Dodge, M.D. Proc. Date: 05/08/01 Admit Date:  05/10/2001 Discharge Date: 05/10/2001                            Consultation Report  REASON FOR CONSULTATION:  This patient is a 55 year old black female on Prempro for post menopausal, who had an attempted uterine artery embolization back in October 2002 which was unsuccessful. She has done pretty well up until recently where she began spotting about a month ago and with one week of low abdominal pain. The patient had a previous abnormal Pap smear which was repeated today.  PHYSICAL EXAMINATION:  ABDOMEN:  Soft, flat, nontender, no masses, no organomegaly.  PELVIC:  EGBUS within normal limits and vagina was clean with a slight amount of burgundy colored clotty discharge from the cervix. The introitus is marital and the cervix was ______ with a moderate amount of the same bloody discharge. The bimanual pelvic revealed a uterus that was retroverted into the cul-de-sac. The fundus could be palpated, but the outline, because of the patients obesity, was not well evaluated and adnexa could not be palpated. However, the patient did not elicit pain on motion over the cervix.  IMPRESSION:  Post menopausal bleeding on Prempro and pelvic pain, uterine fibroids, previous atypical Pap smear. A Pap smear was repeated.  PLAN:  Get a sonogram of the pelvis and return in two weeks for consultation and a decision on subsequent treatment. Dictated by:   Elinor Dodge, M.D. Attending Physician:  Susy Manor B DD:  05/08/01 TD:  05/09/01 Job: [id number]

## 2010-06-23 NOTE — Procedures (Signed)
[facility]  Patient:    ANNAI, HEICK                         MRN: [id number] Proc. Date: 12/18/99 Adm. Date:  [id number] Attending:  Judeth Cornfield CC:         Talmage Coin, M.D.  Jannette Fogo, M.D.   Procedure Report  PROCEDURE:  Percutaneous liver biopsy.  HISTORY:  Mrs. Dacruz has had persistently abnormal liver tests characterized by elevation of alkaline phosphatase.  She has a history of diabetes. Previous serologic work-up has been negative.  The test is performed for further evaluation.  INFORMED CONSENT:  The patient provided consent after the risks, benefits, and alternatives were explained.  MEDICATIONS:  Versed 2.5 mg and fentanyl 37.5 mcg IV.  DESCRIPTION OF PROCEDURE:  The patient was placed in the supine position.  The liver was previously marked with ultrasound.  The skin was prepped with Betadine and injected with 1% lidocaine.  A percutaneous liver biopsy was performed via one pass at the right ninth intercostal space utilizing a tissue biopsy 16-gauge needle.  A 1 cm liver core was obtained.  The patient tolerated the procedure well. DD:  12/18/99 TD:  12/18/99 Job: [id number]

## 2010-06-23 NOTE — Op Note (Signed)
[facility]  Patient:    Regina Stevenson, Regina Stevenson Visit Number: [id number] MRN: [id number]          Service Type: GYN Location: 9300 [id number] Attending Physician:  Amada Kingfisher. Dictated by:   Argentina Donovan, M.D. Proc. Date: 05/29/01 Admit Date:  08/28/2001                             Operative Report  PROCEDURE:                    Supracervical abdominal hysterectomy with lysis of massive pelvic adhesions and right oophorectomy.  PREOPERATIVE DIAGNOSES:       Chronic pelvic pain, symptomatic fibroids, menorrhagia.  POSTOPERATIVE DIAGNOSES:      Chronic pelvic pain, symptomatic fibroids, menorrhagia, massive pelvic adhesions.  PROCEDURE AS FOLLOWS:         Under satisfactory general anesthesia after the vagina had been prepped and a Foley catheter in the urinary bladder, the abdomen was prepped and draped in the usual sterile manner and entered through a transverse incision situated just below the abdominal apron and extended for a total length of 16 cm.  The abdomen was entered by layer down through the fascia and opened as a Pfannenstiel with a vertical opening into the peritoneal cavity.  Once in the peritoneal cavity because of the massive pelvic adhesions, it was obvious that we would have to transect the rectus muscles which was done in the ______ fashion with the inferior hypogastrics being clamped, divided, and ligated with 1 chromic catgut ties.  The peritoneum was then opened transversely and attention directed to the pelvis. The uterus was completely encased in overhanging adhesions of bowel which had to be meticulously separated, dissected away from the area.  The bladder flap was also almost to the fundus of the anterior surface and that had to be taken down with meticulous care.  The ovary on the right side was identified first during our dissection in which in the beginning no pelvic organs with the exception of a palpable small uterus were  obvious.  The left ovary and tube were not palpable.  Much time and meticulous dissection was used to carry out the separation and the isolation of the right ovary, the round ligament on that side, and finally free up the uterus from the adhesions and separating the bladder and pushing it away from the anterior surface of the uterus.  Once this was done, attention was directed to the left side of the uterus which was more difficult because of the involvement of the sigmoid colon.  This was all eventually taken down and the fundus of the uterus completely freed up.  The distortion of the anatomy on the uterus made isolation on the left side of the uterine vessels difficult but with further dissection these were isolated.  On the right side the round ligament was divided, ligated with #1 chromic catgut suture ligature, and the infundibulopelvic ligament dissected away from the wall retroperitoneally.  Those vessels singly clamped and divided and ligated with #1 chromic catgut suture ligature which freed up the ovary.  Then opening made in the avascular portion of the broad ligament on that side and free ties placed through this and tied down around the utero-ovarian ligament and the tissue medially divided.  Medial uterine vessels were skeletonized on that side of the uterus, doubly clamped, divided, and ligated with #1 chromic catgut suture ligated  and serially from the utero-ovarian ligament down to the area of the uterine vessels on the left side was clamped, divided, and ligated with #1 chromic catgut suture ligature.  Once we were certain we had the uterine vessels, the uterus was dissected away from the cervix by sharp dissection and because of the massive adhesions persisting and the long cervix in this nulligravida patient, it was decided to do a supracervical hysterectomy and the top of the cervix was oversewn with a continuous running locked 1 chromic catgut suture for hemostasis.   The area was observed for bleeding.  None was noted.  We decided because of the devascularization already in the right ovary we would remove that which we did by clamping it just beneath the pedicle and away from the side wall.  That was removed leaving a portion of the tube on that side.  The left ovary could not be palpated in the massive adhesions on that side and was down below the sigmoid colon.  It was decided not to go after that ovary.  At this time the rest of the viscera was explored and found to be normal with exception of some omentum of the superior portion of the abdominal incision which was attached to the anterior abdominal wall and was freed up by sharp dissection and tied off with a 2-0 plain catgut.  The peritoneum was then closed with a continuous running 0 Vicryl on an atraumatic needle and the fascia from either incision being the mid point with continuous running interlocked 0 Vicryl on an atraumatic needle.  Skin bleeders were controlled with hot cautery and skin staples used for skin edge approximation.  Dry, sterile dressing was applied.  Tape, instrument, sponge, and needle count were reported correct before closure of the peritoneal cavity.  The patient had a total blood loss about 600 cc. During the procedure, indigo carmine was injected and seemed to flow freely into the bag of the Foley and the patient went to recovery room in satisfactory condition. Dictated by:   Argentina Donovan, M.D. Attending Physician:  Amada Kingfisher. DD:  08/28/01 TD:  09/01/01 Job: [id number]

## 2010-06-23 NOTE — Op Note (Signed)
NAME:  Regina Stevenson, Regina Stevenson NO.:  [id number]   MEDICAL RECORD NO.:  [id number]                   PATIENT TYPE:  OIB   LOCATION:  3305                                 FACILITY:  [facility]   PHYSICIAN:  Lucky Cowboy, M.D.                    DATE OF BIRTH:  1956-01-07   DATE OF PROCEDURE:  09/15/2003  DATE OF DISCHARGE:                                 OPERATIVE REPORT   PREOPERATIVE DIAGNOSIS:  Obstructive sleep apnea with bilateral inferior  turbinate hypertrophy, tonsillar hypertrophy.   POSTOPERATIVE DIAGNOSIS:  Obstructive sleep apnea with bilateral inferior  turbinate hypertrophy, tonsillar hypertrophy.   OPERATION PERFORMED:  Bilateral inferior turbinate reductions,  tonsillectomy, uvulopalatopharyngoplasty.   SURGEON:  Lucky Cowboy, M.D.   ANESTHESIA:  General.   ESTIMATED BLOOD LOSS:  Less than 20 mL.   SPECIMENS:  Tonsils and uvula.   COMPLICATIONS:  None.   INDICATIONS FOR PROCEDURE:  The patient is a 55 year old female who has  severe sleep apnea with an RDI of 51.  She has not been able to tolerate the  CPAP machine despite multiple efforts of changing masks and settings by  Marcelyn Bruins, M.D.  She also has chronic nasal obstruction with very profuse  inferior turbinate hypertrophy.  She also has a narrowed oropharyngeal inlet  and an excessive length to her soft palate.  For these reasons, the above  procedures are performed.   FINDINGS:  The patient was noted to have both bony and mucosal inferior  turbinate hypertrophy causing nasal obstruction.  She was also noted to have  2+ bilateral palatine tonsils with significantly redundant and thickened  soft palate.   DESCRIPTION OF PROCEDURE:  The patient was taken to the operating room and  placed on the table in the supine position.  She was then placed under  general endotracheal anesthesia and the turbinates reduced.  Each side of  the nasal cavity was decongested with Afrin on cottonoid  pledgets.  1%  lidocaine with 1:100,000 epinephrine was then used to inject both of the  inferior turbinates.  The microdebrider was then used to reduce the inferior  one half of the mucosa on both of the inferior turbinates.  Redundant mucosa  and bone was then taken down after dissecting away mucosa with a through-cut  forceps.  Suction cautery was used for hemostasis. At this point, the table  was rotated counterclockwise 90 degrees.  The neck was gently extended and a  Crowe-Davis mouth gag with a #4 tongue blade placed intraorally, opened and  suspended on the Mayo stand.  Palpation of the soft palate identified the  levator dimple which was the point of resection of the turbinate and soft  palate.  Both of the tonsils were removed.  The right palatine tonsil was  grasped with Allis clamps and directed inferomedially.  Harmonic scalpel was  then used to resect  the tonsil, staying within the peritonsillar space  adjacent to the tonsillar capsule.  The left palatine tonsil was removed in  identical fashion.  At this point the uvula was resected and back cuts were  taken in both of the lateral portions of the anterior tonsillar pillars.  This was performed using Bovie cautery.  Mucosal edges of the uvula and the  anterior and posterior pillars were reapproximated in simple interrupted  fashion using 4-0 Vicryl.  The oral cavity was suctioned and an NG tube  placed down the esophagus for suctioning of the gastric contents.  The mouth  gag was removed noting no damage to the teeth or soft tissues.  The table  was rotated clockwise 90 degrees to its original position.  Bactroban coated  Telfa packs were placed on each side of the nasal cavity and tied to one  another anterior to the columella. The patient was awakened from anesthesia  and taken to the post anesthesia care unit in stable condition.  There were  no complications.                                               Lucky Cowboy,  M.D.    SJ/MEDQ  D:  09/15/2003  T:  09/16/2003  Job:  [id number]   cc:   Marcelyn Bruins, M.D. [facility]   Zetta Bills, MD  Internal Medicine Resident - [facility]  [place]  [place] [address]  Fax: [phone]

## 2010-06-23 NOTE — Discharge Summary (Signed)
NAME:  Regina Stevenson, Regina Stevenson                            ACCOUNT NO.:  [id number]   MEDICAL RECORD NO.:  [id number]                   PATIENT TYPE:  INP   LOCATION:  9108                                 FACILITY:  [facility]   PHYSICIAN:  Phil D. Okey Dupre, M.D.                  DATE OF BIRTH:  12-Dec-1955   DATE OF ADMISSION:  08/28/2001  DATE OF DISCHARGE:  09/02/2001                                 DISCHARGE SUMMARY   HOSPITAL COURSE:  The patient is a 55 year old black female who, on the day  of admission, underwent supracervical abdominal hysterectomy and right  oophorectomy with lysis of pelvic adhesions for chronic pelvic pain,  menorrhagia, failed uterine embolization and fibroids.  The surgery was  difficult because of the massive adhesions and the patient had a 600 cc  blood loss.  The patient took some time to recovery in the recovery room and  subsequently was transferred to the adult intensive care unit where she was  on telemetry and oxygen, however, did well for the three days that she was  there.  The patient, on admission, had a hemoglobin of 12 and on discharge  9.2 with a hematocrit of 36.3 on admission and 27.9 on discharge.  Orthostatics showed that the patient did not require a blood transfusion and  so it was felt to wait and treat her with iron on discharge.   The only significant lab test that was abnormal during her stay was that she  had a white count of 9.1 on admission and in the recovery room a 24.8 white  count which could not be explained.  This has come down since she has been  here and the day before discharge it was 13.7.   She had been febrile for the first several days, was treated with triple  antibiotics and is continued through her stay.  For the last few days of her  stay she had a temperature above 100 only once each day.  Physical  examination failed to reveal the source of this.  There was no sign that we  could find of thromboembolic disease.  She had normal  chest and chest x-ray.  Abdomen was soft, flat and nontender except in the normal area of the  surgical incision.  There was no sign of any wound infection.  The staples  were removed two days prior to her discharge.  The wound was clean.   The patient appears well, on discharge is anxious to go home.  She is to be  seen in [facility] prior to her discharge and we will follow her in the clinic in  one week.  She has been instructed as to return to hospital if she is having  bleeding or fever or significant dizziness.  She has also been counseled as  to her followup and physical activity and diet.   DISCHARGE DIAGNOSES:  1. Hypertension.  2. Insulin-dependent diabetes.  3. Status post supracervical abdominal hysterectomy.  4. Fever of unknown origin.  If persistent will be worked up in the clinic     for this.                                               Phil D. Okey Dupre, M.D.    PDR/MEDQ  D:  09/02/2001  T:  09/08/2001  Job:  [id number]

## 2010-06-23 NOTE — Assessment & Plan Note (Signed)
[facility]                           GASTROENTEROLOGY OFFICE NOTE   Regina Stevenson, Regina Stevenson                         MRN:          [id number]  DATE:11/01/2005                            DOB:          11-May-1955    PROBLEM:  Reflux.   HISTORY OF PRESENT ILLNESS:  Regina Stevenson has returned complaining of chest  discomfort.  She has burning chest pain with tightness in her breathing that  tends to occur after retiring.  Since starting Nexium twice a day the  symptoms have improved, though they remain.  She denies she has had  hoarseness and coughing and a sore throat.  She denies dysphagia or  odynophagia.   PHYSICAL EXAMINATION:  VITAL SIGNS:  Pulse 68, blood pressure 120/70, weight  273.   IMPRESSION:  Gastroesophageal reflux disease, partially responsive to  Nexium.  She has a history of diabetes and gastroparesis could be  contributing.   RECOMMENDATIONS:  1. Upper endoscopy.  2. Discontinue Nexium and begin Zegrid 40 mg q.h.s.  3. To consider gastric emptying scan if symptoms are not improved with      change in therapy.       Barbette Hair. Arlyce Dice, MD,FACG      RDK/MedQ  DD:  11/01/2005  DT:  11/03/2005  Job #:  [id number]   cc:   Ellie Lunch, M.D.

## 2010-07-12 ENCOUNTER — Other Ambulatory Visit: Payer: Self-pay | Admitting: *Deleted

## 2010-07-13 MED ORDER — AMLODIPINE BESYLATE 10 MG PO TABS: 10.0000 mg | ORAL_TABLET | Freq: Every day | ORAL | Status: DC

## 2010-07-13 MED ORDER — MOMETASONE FUROATE 50 MCG/ACT NA SUSP: 1.0000 | Freq: Every day | NASAL | Status: DC

## 2010-08-07 ENCOUNTER — Other Ambulatory Visit: Payer: Self-pay | Admitting: *Deleted

## 2010-08-07 MED ORDER — PANTOPRAZOLE SODIUM 40 MG PO TBEC: 40.0000 mg | DELAYED_RELEASE_TABLET | Freq: Every day | ORAL | Status: DC

## 2010-08-07 NOTE — Telephone Encounter (Signed)
Last seen 3/12 and was supposed to F/U few weeks. Will ask front desk to sch appt. The original Rx was printed so I redid it to be sent electronically.

## 2010-08-08 NOTE — Telephone Encounter (Signed)
Faxed to the [facility]. 

## 2010-08-23 ENCOUNTER — Other Ambulatory Visit: Payer: Self-pay | Admitting: *Deleted

## 2010-08-23 MED ORDER — IPRATROPIUM-ALBUTEROL 18-103 MCG/ACT IN AERO: 4.0000 | INHALATION_SPRAY | Freq: Four times a day (QID) | RESPIRATORY_TRACT | Status: DC | PRN

## 2010-08-23 NOTE — Telephone Encounter (Signed)
Combivent refilled- rx refill request form faxed to [facility] program.

## 2010-08-28 ENCOUNTER — Telehealth: Payer: Self-pay | Admitting: *Deleted

## 2010-08-28 NOTE — Telephone Encounter (Signed)
This patient has been on this dose for several visits.   If the prescriber is concerned, change to 3 puffs qid.  It is unlikely to matter, either for benefit or harm.

## 2010-08-28 NOTE — Telephone Encounter (Signed)
[facility] de[facility] calls to say that the change of the combivent from 2 puffs every 4 hrs to 4 puffs 4 times daily is too much, the max dose is 12 puffs in 24 hrs. Could we change this or is that what was truly intended? Please advise  Thanks,h.

## 2010-08-29 NOTE — Telephone Encounter (Signed)
Pharm informed

## 2010-09-07 ENCOUNTER — Encounter: Payer: Self-pay | Admitting: Internal Medicine

## 2010-09-11 ENCOUNTER — Encounter: Payer: Self-pay | Admitting: Internal Medicine

## 2010-09-14 ENCOUNTER — Encounter: Payer: Self-pay | Admitting: Internal Medicine

## 2010-09-22 ENCOUNTER — Encounter: Payer: Self-pay | Admitting: Internal Medicine

## 2010-09-22 ENCOUNTER — Ambulatory Visit (INDEPENDENT_AMBULATORY_CARE_PROVIDER_SITE_OTHER): Payer: Self-pay | Admitting: Internal Medicine

## 2010-09-22 VITALS — BP 136/81 | HR 84 | Temp 98.5°F | Ht 67.0 in | Wt 250.2 lb

## 2010-09-22 DIAGNOSIS — E669 Obesity, unspecified: Secondary | ICD-10-CM

## 2010-09-22 DIAGNOSIS — B373 Candidiasis of vulva and vagina: Secondary | ICD-10-CM

## 2010-09-22 DIAGNOSIS — E1149 Type 2 diabetes mellitus with other diabetic neurological complication: Secondary | ICD-10-CM

## 2010-09-22 DIAGNOSIS — G4700 Insomnia, unspecified: Secondary | ICD-10-CM

## 2010-09-22 DIAGNOSIS — E119 Type 2 diabetes mellitus without complications: Secondary | ICD-10-CM

## 2010-09-22 DIAGNOSIS — I10 Essential (primary) hypertension: Secondary | ICD-10-CM

## 2010-09-22 DIAGNOSIS — J309 Allergic rhinitis, unspecified: Secondary | ICD-10-CM

## 2010-09-22 DIAGNOSIS — K219 Gastro-esophageal reflux disease without esophagitis: Secondary | ICD-10-CM

## 2010-09-22 DIAGNOSIS — E785 Hyperlipidemia, unspecified: Secondary | ICD-10-CM

## 2010-09-22 DIAGNOSIS — K3184 Gastroparesis: Secondary | ICD-10-CM

## 2010-09-22 MED ORDER — ZOLPIDEM TARTRATE ER 12.5 MG PO TBCR: 12.5000 mg | EXTENDED_RELEASE_TABLET | Freq: Every evening | ORAL | Status: DC | PRN

## 2010-09-22 MED ORDER — FREESTYLE LANCETS MISC: Status: AC

## 2010-09-22 MED ORDER — GLUCOSE BLOOD VI STRP: ORAL_STRIP | Status: AC

## 2010-09-22 MED ORDER — FLUCONAZOLE 150 MG PO TABS: 150.0000 mg | ORAL_TABLET | Freq: Once | ORAL | Status: AC

## 2010-09-22 NOTE — Assessment & Plan Note (Addendum)
Patient states that her GERD is well-controlled on Protonix 40 mg daily. However she states that if she doesn't take her medication she can tell the difference. I advised her to keep taking it daily.

## 2010-09-22 NOTE — Assessment & Plan Note (Signed)
Patient is up-to-date on all of her screening for diabetes, hemoglobin A1c was 7.8 today. This is down from last hemoglobin A1c of 8.5. I did correct her usage of her insulin. She was only taking 30 units in the morning and evening. She is supposed to be taking Humulin 50 units in the morning 45 units at night I did advise her to restart this regimen. No other changes at today's visit. She has lost 30 pounds since last visit, and I did congratulate her on this and advised her to keep losing weight. Last microalbumin creatinine ratio was normal no evidence of protein wasting. Foot exam today was normal. We will check a fasting lipid panel at today's visit. Continue current regimen. Encouraged diet and exercise modification. She has had no hypoglycemic events. She has had pneumonia vaccine.

## 2010-09-22 NOTE — Patient Instructions (Signed)
You were seen today to meet your new doctor, Dr. Dorise Hiss. We are giving you diflucan for your yeast infection. Please take one pill. If your infection does not clear take one more pill in 3 days. Please go back to taking 50 units of humulin in the morning and 45 units at night time. Check your blood sugar 1-2 times per day. We are giving you ambien to try for your sleep. We will see you back in 6 weeks to see how things are doing. Continue to exercise and eat healthy to continue losing weight! Remember to take some time for yourself to help alleviate stress. If you have any questions or need to be seen sooner please call our office at [phone].  2400 Calorie Diabetic Diet The 2400 calorie diabetic diet is designed for eating up to 2400 calories each day. Following this diet and making healthy meal choices can help improve overall health. It controls blood sugar levels, and can also lower blood pressure and cholesterol.  SERVING SIZES: Measuring foods and serving sizes helps to make sure you are getting the right amount of food. The list below tells how big or small some common serving sizes are.  1 ounce (oz.)................4 stacked dice.   3 oz.............................Marland KitchenDeck of cards.   1 teaspoon (tsp) .........Marland KitchenTip of little finger.   1 tablespoon (Tbsp)...Marland KitchenMarland KitchenThumb.   2 Tbsp.........................Marland KitchenGolf ball.    Cup.........................Marland KitchenHalf of a fist.   1 Cup..........................Marland KitchenA fist.  GUIDELINES FOR CHOOSING FOODS: The goal of this diet is to eat a variety of foods and limit calories to 2400 each day. This can be done by choosing foods that are low in calories and in fat. The diet also suggests eating small amounts of food often. Doing this helps control your blood sugar levels so they do not get too high or low. Each meal or snack should contain a protein food source to help you feel more satisfied and to stabilize your blood sugar. Try to eat about the same amount of  food around the same time each day. This includes weekend days, travel days and days off work. Space your meals about 4-5 hours apart and add a snack between them if you wish.  For example, a daily food plan could include breakfast, a morning snack, lunch, dinner and an evening snack. Healthy meals and snacks have a variety of foods including whole grains, vegetables, fruits, lean meats, poultry, fish and dairy products. As you plan your meals, select a variety of foods. Choose from the bread and starch, vegetable, fruit, dairy and meat/protein groups. Examples of foods from each group are listed below with their suggested serving sizes. Use measuring cups and spoons to become familiar with what a healthy portion looks like.  Bread and Starch (each serving equals 15 grams of carbohydrate)  1 slice bread.   1/4 bagel.    cup cold cereal (unsweetened).    cup hot cereal, cooked pasta or mashed potatoes.   1 small potato (size of a computer mouse).   1/3 cup cooked pasta or rice.    English muffin.   1 cup broth based soup.   3 cups of popcorn.   4-6 whole wheat crackers.    cup cooked beans, peas or corn.   Vegetable (each serving equals 5 grams of carbohydrate)   cup cooked vegetables.   1 cup raw vegetables   1/2 cup tomato juice.    Fruit (each serving equals 15 grams of carbohydrate)   1 small apple or  orange.   1 cup watermelon or strawberries.    cup applesauce (no sugar added).   2 Tbsp raisins.    banana.    cup unsweetened canned fruit.    cup unsweetened fruit juice.   Dairy (each serving equals 12-15 grams of carbohydrate)  1 cup fat free milk.   6 oz artificially sweetened yogurt.   1 cup buttermilk   1 cup soy milk    Meat/Protein  1 large egg.   2-3 oz meat, poultry or fish.    cup cottage cheese.   1 Tbsp peanut butter.    cup tofu.   1 oz cheese.    cup canned tuna in water.   SAMPLE 2400 CALORIE DIET  PLAN: Breakfast:  1 english muffin (2 carb servings).  1 scrambled egg.   2 tsp margarine.  Fat free milk, 1 cup (1 carb serving).   1 large orange (2 carb servings).   Morning Snack:  Low fat cottage cheese, 1/4 cup.   cup canned peaches in juice (1 carb serving).   Carrot sticks, 1 cup.  5 whole wheat melba toasts (1 carb serving).   Lunch:  Grilled chicken salad.  2 oz chicken breast.   Romaine lettuce or spinach, 1 cup.   Diced tomato,  cup.   Shredded carrots,  cup.   Sliced cucumbers,  cup.   Low fat salad dressing, 2 Tbsp.   Whole wheat bread, 2 slices (2 carb servings).  1 small apple (1 carb serving).   Fat free milk, 1 cup (1 carb serving).   15 baked chips ( 1 carb serving).   Afternoon Snack:  8 reduced fat triscuits (2 carb servings).  Peanut butter, 2 Tbsp.   Dinner:  3 oz salmon, broiled.  4.5 small red potatoes, roasted with 1 tsp olive oil and seasoning (3 carb servings).   Green beans, 1 cup.  Strawberries, 1  cup (1 carb serving).   Fat free milk, 1 cup (1 carb serving).   Evening Snack:  6 cups air popped popcorn (2 carb servings).  2 Tbsp parmesan cheese sprinkled on top.   8 almonds.   Meal Plan You can use this worksheet to help you make a daily meal plan based on the 2400 calorie diabetic diet suggestions. If you are using this plan to help you control your blood glucose, you may interchange carbohydrate containing foods (dairy, starches, and fruits). Select a variety of fresh foods of varying colors and flavors. The total amount of carbohydrate in your meal or snack is more important than making sure you include all of the food groups every time you eat. Remember that you should choose the following foods for a day's meals:  13 Starches.  4 Vegetables.   4 Fruits.   3 Dairy.   8 oz Meat.   Up to 8 Fats.   Your dietician can use this worksheet to help you decide how many servings and what types of foods are right  for you. Breakfast Food Group and Servings Food Choice Starches ________________________________________________________ Dairy __________________________________________________________ Fruit ___________________________________________________________ Meat ___________________________________________________________ Fat_________________________________________________________ Serina Cowper Food Group and Servings Food Choice  Starches _________________________________________________________ Meat __________________________________________________________ Vegetables _____________________________________________________ Fruit __________________________________________________________ Dairy _________________________________________________________ Fat________________________________________________________ Afternoon Snack Food Group and Servings Food Choice Starch ____________________________________________________ Meat_____________________________________________________ Laural Golden Food Group and Servings Food Choice Starches _______________________________________________________ Meat __________________________________________________________ Dairy _________________________________________________________ Vegetable ______________________________________________________ Fruit __________________________________________________________ Fat_______________________________________________________ Evening Snack Food Group and Servings Food Choice Fruit __________________________________________________________ Meat __________________________________________________________ Starches _______________________________________________________ Daily Totals Starches _________________________ Vegetables  _________________________ Avelino Leeds _____________________________ Dairy _____________________________ Meat ______________________________ Fats _______________________________  Document Released: 08/14/2004  Document Re-Released: 07/12/2009 [employer] Patient Information 2011 [place], [place].  Insomnia Insomnia is frequent trouble falling and/or staying asleep. Insomnia can be a long term problem or a short term problem. Both are common. Insomnia can be a short term problem when the wakefulness is related to a certain stress or worry. Long term insomnia is often related to ongoing stress during waking hours and/or poor sleeping habits. Overtime, sleep deprivation itself can make the problem worse. Every little thing feels more severe because you are overtired and your ability to cope is decreased. SYMPTOMS  Not feeling rested in the morning.   Anxiety and restlessness at bedtime.   Difficulty falling and staying asleep.  CAUSES  Stress, anxiety, and depression.   Poor sleeping habits.   Distractions such as TV in the bedroom.   Naps close to bedtime.   Engaging in emotionally charged conversations before bed.   Technical reading before sleep.   Alcohol and other sedatives. They may make the problem worse. They can hurt normal sleep patterns and normal dream activity.   Stimulants such as caffeine for several hours prior to bedtime.   Pain syndromes and shortness of breath can cause insomnia.   Exercise late at night.   Changing time zones may cause sleeping problems (jet lag).  It is sometimes helpful to have someone observe your sleeping patterns. They should look for periods of not breathing during the night (sleep apnea). They should also look to see how long those periods last. If you live alone or observers are uncertain, you can also be observed at a sleep clinic where your sleep patterns will be professionally monitored. Sleep apnea requires a checkup and treatment. Give your caregivers your medical history. Give your caregivers observations your family has made about your sleep.  TREATMENT  Your caregiver may prescribe treatment for an underlying medical disorders. Your  caregiver can give advice or help if you are using alcohol or other drugs for self-medication. Treatment of underlying problems will usually eliminate insomnia problems.   Medications can be prescribed for short time use. They are generally not recommended for lengthy use.   Over-the-counter sleep medicines are not recommended for lengthy use. They can be habit forming.   You can promote easier sleeping by making lifestyle changes such as:   Using relaxation techniques that help with breathing and reduce muscle tension.   Exercising earlier in the day.   Changing your diet and the time of your last meal. No night time snacks.   Establish a regular time to go to bed.   Counseling can help with stressful problems and worry.   Soothing music and white noise may be helpful if there are background noises you cannot remove.   Stop tedious detailed work at least one hour before bedtime.  HOME CARE INSTRUCTIONS  Keep a diary. Inform your caregiver about your progress. This includes any medication side effects. See your caregiver regularly. Take note of:   Times when you are asleep.  Times when you are awake during the night.   The quality of your sleep.  How you feel the next day.   This information will help your caregiver care for you.  Get out of bed if you are still awake after 15 minutes. Read or do some quiet activity. Keep the lights down. Wait until you feel sleepy and go back to bed.   Keep regular sleeping and waking  hours. Avoid naps.   Exercise regularly.   Avoid distractions at bedtime. Distractions include watching television or engaging in any intense or detailed activity like attempting to balance the household checkbook.   Develop a bedtime ritual. Keep a familiar routine of bathing, brushing your teeth, climbing into bed at the same time each night, listening to soothing music. Routines increase the success of falling to sleep faster.   Use relaxation techniques.  This can be using breathing and muscle tension release routines. It can also include visualizing peaceful scenes. You can also help control troubling or intruding thoughts by keeping your mind occupied with boring or repetitive thoughts like the old concept of counting sheep. You can make it more creative like imagining planting one beautiful flower after another in your backyard garden.   During your day, work to eliminate stress. When this is not possible use some of the previous suggestions to help reduce the anxiety that accompanies stressful situations.  MAKE SURE YOU:   Understand these instructions.   Will watch your condition.   Will get help right away if you are not doing well or get worse.  Document Released: 01/20/2000 Document Re-Released: 01/05/2008 [facility] Patient Information 2011 [place], [place].

## 2010-09-22 NOTE — Progress Notes (Signed)
Subjective:    Patient ID: Regina Stevenson, female    DOB: 01/20/1956, 55 y.o.   MRN: [id number]  HPI: The patient is a 55 year old female who comes in today for a checkup, and to meet her new doctor. Her medical problems do include diabetes mellitus, hyperlipidemia, obesity, hypertension, allergic rhinitis, GERD. She is having a yeast infection today. She is not having any burning on urination, does has a white discharge. No increased frequency of urination no other vaginal discharge. She is also having problems sleeping. She has problems falling asleep at night, but is usually able to stay asleep the whole night once she falls asleep. She is a foster parent, and is undergoing a lot of stress at home. She is being forced to go to court several times in the next month or 2. This is causing her a lot of stress at home. Some of the parents of her foster children are lying to the courts, and are not doing the best thing for their children. She states that she is taking her Humulin N., however she is only taking 30 units in the morning and 30 units at night. She is supposed to be taking 50 units in the morning 45 units at night. She does state that she is taking all her other medications as ordered. She has had no hypoglycemic events over the last few months. She is not a smoker, and has never been. She does not bring her meter in today. I did advise her to bring all her medications and her meter to our next visit. She has lost 30 pounds since her last visit, and I congratulated her on this weight loss.    Review of Systems  Constitutional: Negative for fever, chills, diaphoresis, activity change, appetite change, fatigue and unexpected weight change.  HENT: Negative.   Eyes: Negative.   Respiratory: Negative for cough, chest tightness, shortness of breath and wheezing.   Cardiovascular: Negative.   Gastrointestinal: Negative for nausea, vomiting, abdominal pain, diarrhea and constipation.  Genitourinary:  Positive for vaginal discharge. Negative for dysuria, urgency, frequency, hematuria, flank pain, decreased urine volume, vaginal bleeding, enuresis, difficulty urinating, genital sores, vaginal pain, menstrual problem, pelvic pain and dyspareunia.       Pt does have a white discharge.  Musculoskeletal: Negative.   Skin: Negative.   Neurological: Negative.   Hematological: Negative.   Psychiatric/Behavioral: Positive for sleep disturbance.    Vitals: BP: 136/81 Pulse: 84 Temperature: 98.81F Height 5 feet 7 inches Weight 250 pounds    Objective:   Physical Exam  Constitutional: She is oriented to person, place, and time. She appears well-developed and well-nourished.  HENT:  Head: Normocephalic and atraumatic.  Eyes: EOM are normal. Pupils are equal, round, and reactive to light.  Neck: Normal range of motion. Neck supple. No tracheal deviation present. No thyromegaly present.  Cardiovascular: Normal rate, regular rhythm and normal heart sounds.   No murmur heard. Pulmonary/Chest: Effort normal and breath sounds normal. No respiratory distress. She has no wheezes. She has no rales.  Abdominal: Soft. Bowel sounds are normal. She exhibits no distension. There is no tenderness. There is no rebound and no guarding.  Musculoskeletal: Normal range of motion.  Lymphadenopathy:    She has no cervical adenopathy.  Neurological: She is alert and oriented to person, place, and time. No cranial nerve deficit.  Skin: Skin is warm and dry. No rash noted. No erythema. No pallor.  Psychiatric: She has a normal mood and affect. Her behavior  is normal. Judgment and thought content normal.          Assessment & Plan:  1. Please see problem-oriented charting.  2. Disposition-patient is will be seen back in 6 weeks to check on her sleeping status, and resolution of yeast infection. I have told her that if she wishes to be seen sooner or is still having problems she should call our office. We have  prescribed Ambien, to help her sleep. We have prescribed Diflucan 150 mg by mouth x1. No other changes to her medications at this time, I have advised her to resume her prescribed Humulin dosing.   3. Health maintenance-patient is up-to-date on all of her health maintenance. We have done a fasting lipid panel and hemoglobin A1c at today's visit. She is not due for a Pap smear at this time. 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 I cannot find record of this. However she has had more than 2 normal screens since then so she is able to resume normal screening schedule. She will be due for Pap in 2014.

## 2010-09-22 NOTE — Assessment & Plan Note (Signed)
Patient is fairly well controlled, not quite at goal of 130 systolic. She is very close. We will continue current regimen and encourage her to lose weight.

## 2010-09-22 NOTE — Assessment & Plan Note (Signed)
Patient's weight today is 250 pounds. She has lost 30 pounds since last visit in March. I did congratulate her on this and encourage her to continue losing weight and exercising.

## 2010-09-22 NOTE — Progress Notes (Signed)
I agree with Dr. Kollar's assessment and plan. 

## 2010-09-22 NOTE — Assessment & Plan Note (Signed)
Patient states that she is taking Lipitor 10 mg daily, advised her to keep taking this. We did draw a fasting lipid panel at today's visit. She does have fatty liver disease seen on biopsy in 2005. I also congratulated her on her weight loss and encouraged her to continue losing weight as this would be a better way to decrease her cholesterol.

## 2010-09-22 NOTE — Assessment & Plan Note (Signed)
Patient using Flonase and Clarinex daily. She has not had any flare of her allergy symptoms. She states that summer is a bad time for her allergies.

## 2010-09-22 NOTE — Assessment & Plan Note (Signed)
Patient is having a hard time falling asleep, there is a lot of stress in her life right now. I will prescribe Ambien for her today, to use to help her fall asleep. She does state that she has no problems waking up at night. We will check back in 6 weeks to see how her sleep is going.

## 2010-09-23 LAB — LIPID PANEL
Cholesterol: 230 mg/dL — ABNORMAL HIGH (ref 0–200)
Total CHOL/HDL Ratio: 4.8 Ratio
Triglycerides: 140 mg/dL (ref <150)
VLDL: 28 mg/dL (ref 0–40)

## 2010-11-06 LAB — GLUCOSE, CAPILLARY: Glucose-Capillary: 148 — ABNORMAL HIGH

## 2010-11-10 LAB — GLUCOSE, CAPILLARY: Glucose-Capillary: 140 mg/dL — ABNORMAL HIGH (ref 70–99)

## 2010-11-28 ENCOUNTER — Encounter: Payer: Self-pay | Admitting: Family Medicine

## 2010-11-28 ENCOUNTER — Ambulatory Visit (INDEPENDENT_AMBULATORY_CARE_PROVIDER_SITE_OTHER): Payer: Self-pay | Admitting: Family Medicine

## 2010-11-28 ENCOUNTER — Other Ambulatory Visit: Payer: Self-pay | Admitting: *Deleted

## 2010-11-28 DIAGNOSIS — M25569 Pain in unspecified knee: Secondary | ICD-10-CM

## 2010-11-28 DIAGNOSIS — M67919 Unspecified disorder of synovium and tendon, unspecified shoulder: Secondary | ICD-10-CM

## 2010-11-28 DIAGNOSIS — M7551 Bursitis of right shoulder: Secondary | ICD-10-CM | POA: Insufficient documentation

## 2010-11-28 NOTE — Progress Notes (Signed)
  Subjective:    Patient ID: Regina Stevenson, female    DOB: 04/02/1955, 55 y.o.   MRN: [id number]  HPI  #1. Right knee pain. Previously has had corticosteroid injection with significant relief lasting many months. Has not had a shot essentially since last year. Pain in the right knee is worse as the seasons get cold. Aching type pain is present all the time, worse with activity especially climbing stairs. No locking or giving way. Has not noticed erythema or warmth of the knee and has not seen any swelling. No calf pain  #2. Right shoulder pain. This also seems to be somewhat seasonal. Stiff and sore with overhead activities and was trying to reach backward. No history of shoulder injury. She is right-hand dominant. The numbness or tingling in her hand.  Review of Systems Denies unusual weight change, denies fever, sweats, chills. See history of present illness above for additional details.    Objective:   Physical Exam  Vital signs reviewed. GENERAL: Well developed, well nourished, no acute distress SHOULDER: Right shoulder has full range of motion in all planes of the rotator cuff except internal rotation where she is limited significantly by pain to about 30. Forward flexion is also painful as is supraspinatus testing but strength is intact. Positive impingement sign. Distally she is neurovascularly intact. KNEE: Right knee is ligamentously intact. There is no erythema, no effusion. There is medial and lateral joint line tenderness worse on the medial side. Normal Lachman. Normal McMurray. Calf is soft  .INJECTION: Patient was given informed consent, signed copy in the chart. Appropriate time out was taken. Area prepped and draped in usual sterile fashion. One cc of methylprednisolone plus  4 cc of lidocaine was injected into the right shoulder using a(n) posterior approach. The patient tolerated the procedure well. There were no complications. Post procedure instructions were  given. INJECTION: Patient was given informed consent, signed copy in the chart. Appropriate time out was taken. Area prepped and draped in usual sterile fashion. One cc of methylprednisolone plus  4 cc of lidocaine was injected into the right knee using a(n) anterior approach. The patient tolerated the procedure well. There were no complications. Post procedure instructions were given.        Assessment & Plan:  #1. Osteoarthritis right knee that has previously responded well to corticosteroid injection We injected her today and she will see how she does #2. Subacromial bursitis on the right shoulder. I gave her and explained to her in detail the exercise handout. We also gave her a Theraband. Perform corticosteroid injection today. She will followup when necessary.Marland Kitchen

## 2010-12-01 MED ORDER — MOMETASONE FUROATE 50 MCG/ACT NA SUSP: 1.0000 | Freq: Every day | NASAL | Status: DC

## 2010-12-01 NOTE — Telephone Encounter (Signed)
Nasonex rx refilled - request faxed to [facility] pharmacy.

## 2010-12-12 ENCOUNTER — Encounter: Payer: Self-pay | Admitting: Internal Medicine

## 2010-12-20 ENCOUNTER — Other Ambulatory Visit: Payer: Self-pay | Admitting: *Deleted

## 2010-12-20 MED ORDER — PANTOPRAZOLE SODIUM 40 MG PO TBEC: 40.0000 mg | DELAYED_RELEASE_TABLET | Freq: Every day | ORAL | Status: DC

## 2010-12-20 NOTE — Telephone Encounter (Signed)
Protonix rx refill request faxed to [facility] pharmacy.

## 2011-01-23 ENCOUNTER — Other Ambulatory Visit: Payer: Self-pay | Admitting: *Deleted

## 2011-01-23 MED ORDER — MOMETASONE FUROATE 50 MCG/ACT NA SUSP: 1.0000 | Freq: Every day | NASAL | Status: DC

## 2011-01-23 NOTE — Telephone Encounter (Signed)
Refill was called to the [facility].  Message was left on Voicemail.  Angelina Ok, RN 01/23/2011 1:52 PM

## 2011-02-16 ENCOUNTER — Other Ambulatory Visit: Payer: Self-pay | Admitting: *Deleted

## 2011-02-16 MED ORDER — DESLORATADINE 5 MG PO TABS: 5.0000 mg | ORAL_TABLET | Freq: Every day | ORAL | Status: DC

## 2011-02-22 NOTE — Telephone Encounter (Signed)
Rx called in 

## 2011-02-27 ENCOUNTER — Emergency Department (INDEPENDENT_AMBULATORY_CARE_PROVIDER_SITE_OTHER): Payer: Self-pay

## 2011-02-27 ENCOUNTER — Encounter (HOSPITAL_COMMUNITY): Payer: Self-pay | Admitting: *Deleted

## 2011-02-27 ENCOUNTER — Other Ambulatory Visit: Payer: Self-pay

## 2011-02-27 ENCOUNTER — Telehealth: Payer: Self-pay | Admitting: *Deleted

## 2011-02-27 ENCOUNTER — Emergency Department (INDEPENDENT_AMBULATORY_CARE_PROVIDER_SITE_OTHER)
Admission: EM | Admit: 2011-02-27 | Discharge: 2011-02-27 | Disposition: A | Discharge status: Home Health | Payer: Self-pay | Source: Home / Self Care | Attending: Emergency Medicine | Admitting: Emergency Medicine

## 2011-02-27 DIAGNOSIS — J45909 Unspecified asthma, uncomplicated: Secondary | ICD-10-CM

## 2011-02-27 DIAGNOSIS — J40 Bronchitis, not specified as acute or chronic: Secondary | ICD-10-CM

## 2011-02-27 MED ORDER — PREDNISONE 20 MG PO TABS: 40.0000 mg | ORAL_TABLET | Freq: Every day | ORAL | Status: DC

## 2011-02-27 NOTE — Telephone Encounter (Signed)
Pt calls and states "for a couple of days" she has chest pain that comes and goes, she states she has been coughing and using inhaler more, has hx of asthma, she states she has never had this pain in her chest before, she is ask to go to ED asap, she is agreeable

## 2011-02-27 NOTE — ED Notes (Signed)
Pt reports she has had a dry cough the past week and has been using her combivent inhaler.  This AM about 0200 and 0400  she felt dull  Anterior chest  Pain which lasted a few minutes, then totally resolved.     AT present she c/o tightness in her chest and the mid back.  NO SOB, diaphoresis, or indigestion noted

## 2011-02-27 NOTE — ED Notes (Signed)
Checked on pt, she has no change is assessment.  Explained  that we were waiting on xray result

## 2011-02-27 NOTE — ED Provider Notes (Signed)
History     CSN: [id number]  Arrival date & time 02/27/11  1053   First MD Initiated Contact with Patient 02/27/11 1109      Chief Complaint  Patient presents with  . Asthma    (Consider location/radiation/quality/duration/timing/severity/associated sxs/prior treatment) HPI Comments: At 2 am woke up with pain in my chest "dull" lasted several minutes then again around 4 am the pain woke me up" again" Been having asthma all week, but this pian was different, its better now"  Patient is a 56 y.o. female presenting with asthma. The history is provided by the patient.  Asthma This is a new problem. The current episode started 6 to 12 hours ago. The problem has been resolved. Associated symptoms include chest pain, headaches and shortness of breath. Pertinent negatives include no abdominal pain. The symptoms are aggravated by nothing. The symptoms are relieved by nothing. She has tried nothing for the symptoms. The treatment provided no relief.    Past Medical History  Diagnosis Date  . Diabetic gastroparesis associated with type 2 diabetes mellitus   . Diabetes mellitus     HgA1c 8.1 in 12/2009  . Hyperlipidemia   . Hypertension   . Allergic rhinitis   . Obstructive sleep apnea     Moderately severe obstructive sleep apnea/hypopnea syndrome per Sleep study 04/2009. Patient declined CPAP use (although recommended) secondary to claustrophobia. This did resolved after tonsillectomy in 2005.  Marland Kitchen Fatty liver disease, nonalcoholic   . Obesity   . Cholelithiasis     no symptoms    Past Surgical History  Procedure Date  . Abdominal hysterectomy 05/2001    Supracervical abdominal hysterectomy with lysis  . Ganglion cyst     injected in 11/06, R wrist  . Pap smear     lgsil in 2007  . Tonsillectomy     2005, with partial remnant, L posterior oropharnyx    Family History  Problem Relation Age of Onset  . Coronary artery disease Mother     MI age 15yo, deceased  . Diabetes Mother    . Heart attack Mother   . Coronary artery disease Father     MI age 20s, deceased  . Heart attack Father   . Diabetes type II    . Hypertension    . Breast cancer      History  Substance Use Topics  . Smoking status: Never Smoker   . Smokeless tobacco: Never Used  . Alcohol Use: No    OB History    Grav Para Term Preterm Abortions TAB SAB Ect Mult Living                  Review of Systems  Constitutional: Negative for appetite change.  Respiratory: Positive for cough, chest tightness and shortness of breath.   Cardiovascular: Positive for chest pain. Negative for leg swelling.  Gastrointestinal: Negative for abdominal pain.  Neurological: Positive for headaches.    Allergies  Codeine and Oxycodone-acetaminophen  Home Medications   Current Outpatient Rx  Name Route Sig Dispense Refill  . IPRATROPIUM-ALBUTEROL 18-103 MCG/ACT IN AERO Inhalation Inhale 4 puffs into the lungs 4 (four) times daily as needed for wheezing. 3 Inhaler 3  . AMLODIPINE BESYLATE 10 MG PO TABS Oral Take 1 tablet (10 mg total) by mouth daily. 90 tablet 4  . ATORVASTATIN CALCIUM 10 MG PO TABS Oral Take 10 mg by mouth daily.      . DESLORATADINE 5 MG PO TABS Oral Take 1 tablet (  5 mg total) by mouth daily. 30 tablet 2  . INSULIN ISOPHANE HUMAN 100 UNIT/ML [place] SUSP  Inject 50 units in the morning and 45 units in the evening. 90 mL 3  . INSULIN REGULAR HUMAN 100 UNIT/ML IJ SOLN Subcutaneous Inject into the skin 3 (three) times daily before meals. Inject 45 units in the morning and 50 units in the evening    . MOMETASONE FUROATE 50 MCG/ACT NA SUSP Nasal Place 1 spray into the nose daily. 17 g 3  . PANTOPRAZOLE SODIUM 40 MG PO TBEC Oral Take 1 tablet (40 mg total) by mouth daily. 90 tablet 1  . GLUCOSE BLOOD VI STRP  Use as instructed 100 each 12  . FREESTYLE LANCETS MISC  Use as instructed 100 each 12  . PREDNISONE 20 MG PO TABS Oral Take 2 tablets (40 mg total) by mouth daily. 2 tablets daily for 5 days  10 tablet 0  . QUINAPRIL HCL 40 MG PO TABS Oral Take 1 tablet (40 mg total) by mouth at bedtime. 90 tablet 3  . ZOLPIDEM TARTRATE ER 12.5 MG PO TBCR Oral Take 1 tablet (12.5 mg total) by mouth at bedtime as needed for sleep. 30 tablet 1    BP 147/84  Pulse 94  Temp(Src) 97.7 F (36.5 C) (Oral)  Resp 18  SpO2 99%  LMP 04/23/1988  Physical Exam  Nursing note and vitals reviewed. Constitutional: She appears well-developed and well-nourished. No distress.  HENT:  Head: Normocephalic.  Neck: Neck supple. No JVD present.  Cardiovascular: Normal rate.   No murmur heard. Pulmonary/Chest: Effort normal. No apnea. No respiratory distress. She has no decreased breath sounds. She has no wheezes. She has no rhonchi. She has no rales. She exhibits no tenderness.  Abdominal: Normal appearance. She exhibits no distension.  Skin: No rash noted. She is not diaphoretic. No erythema.    ED Course  Procedures (including critical care time)  Labs Reviewed - No data to display Dg Chest 2 View  02/27/2011  *RADIOLOGY REPORT*  Clinical Data: Chest pain and shortness of breath  CHEST - 2 VIEW  Comparison: Chest radiograph 03/27/2010  Findings: Heart, mediastinal, and hilar contours are normal in stable.  Mild chronic prominence of the interstitial lung markings is stable.  There are no focal airspace opacities, edema, or pleural effusions.  No acute bony abnormality identified.  IMPRESSION: Chronic mild interstitial prominence.  No acute findings.  Original Report Authenticated By: Britta Mccreedy, M.D.     1. Asthma   2. Bronchitis       MDM  Asthma activity with intermittent substernal CP last 12 hours- stable normal EKG at [facility], MD 02/27/11 1853

## 2011-03-04 IMAGING — CT CT ANGIO CHEST
2 of 6 series · 19 of 36 positions shown · IV contrast (APPLIED)
Comparison: Radiography same day

CLINICAL DATA: Chest pain.  Short of breath.

CT ANGIOGRAPHY CHEST WITH CONTRAST
TECHNIQUE: Multidetector CT imaging of the chest was performed
using the standard protocol during bolus administration of
intravenous contrast.  Multiplanar CT image reconstructions
including MIPs were obtained to evaluate the vascular anatomy.
Contrast:  85 ml Omnipaque 350

[Series 8: pulm embolism 1.0 b25f thins · axial · 0.68mm/px · z∈[-376,-138]mm · 18 of 266 slices shown]
[im 14/266  lung]
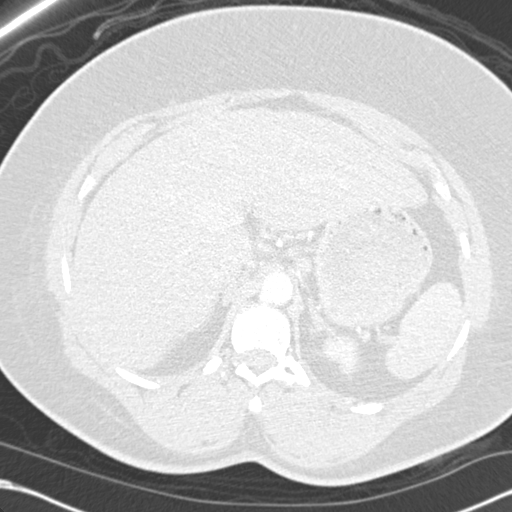
[im 27/266  mediastinal]
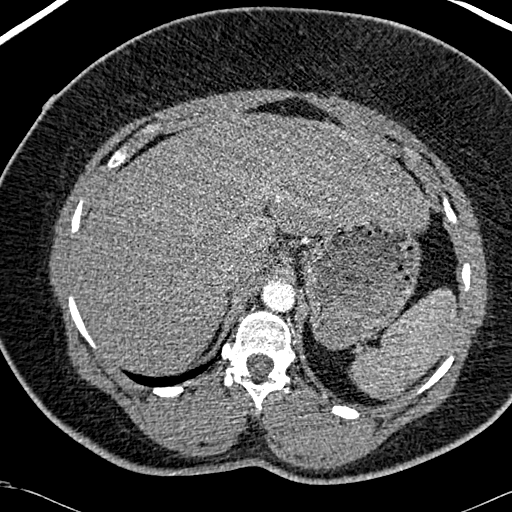
[im 40/266  lung]
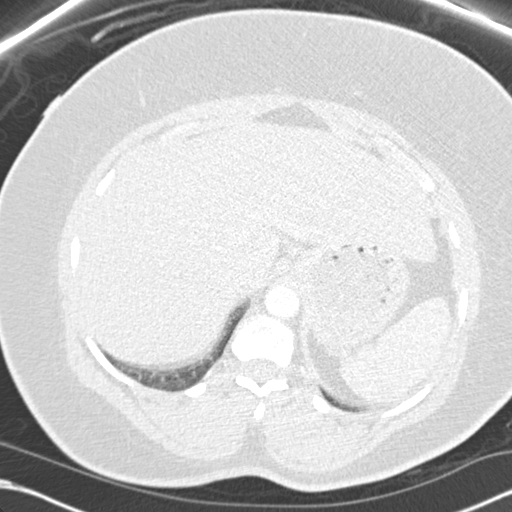
[im 54/266  mediastinal]
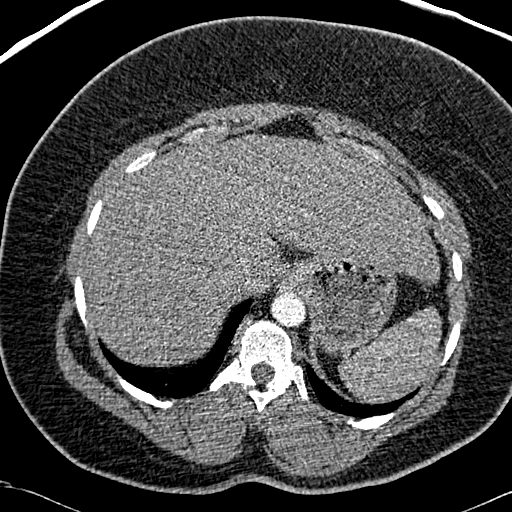
[im 67/266  lung]
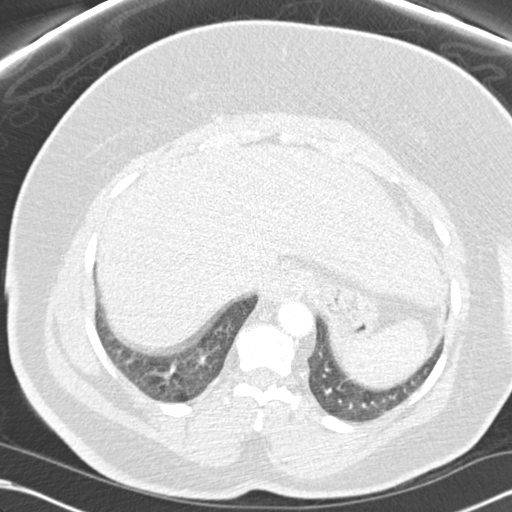
[im 80/266  mediastinal]
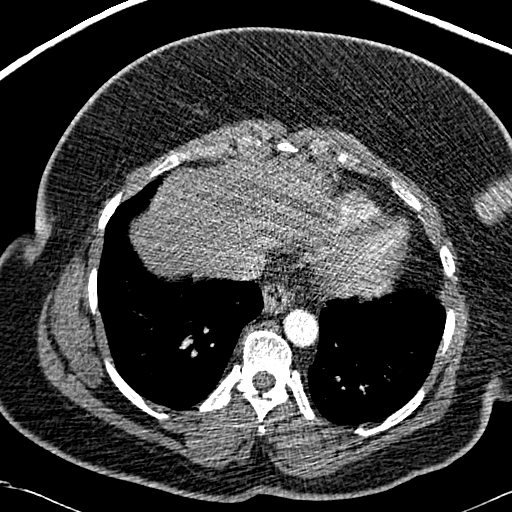
[im 93/266  lung]
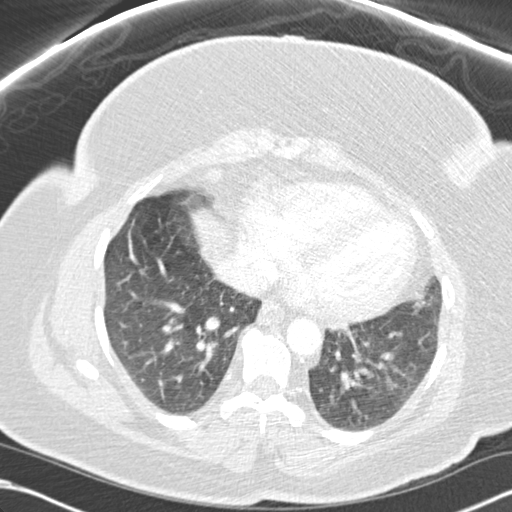
[im 107/266  mediastinal]
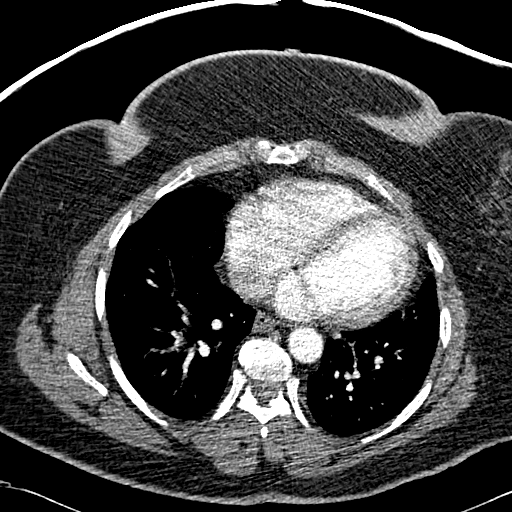
[im 120/266  lung]
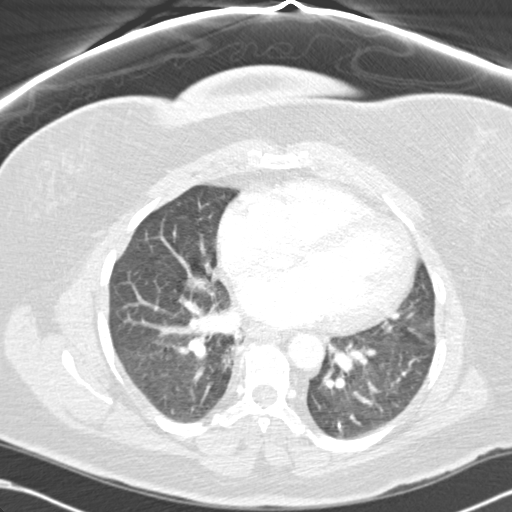
[im 146/266  mediastinal]
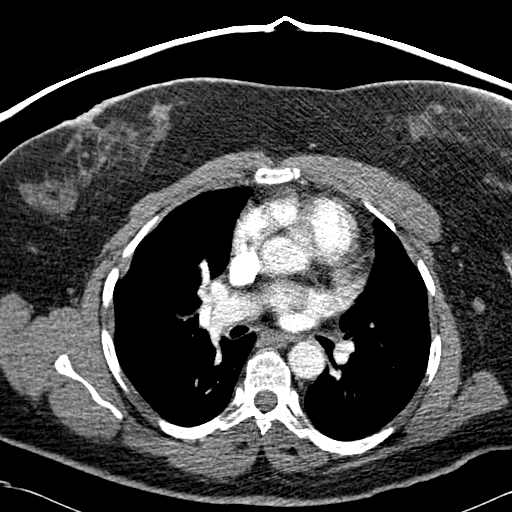
[im 160/266  lung]
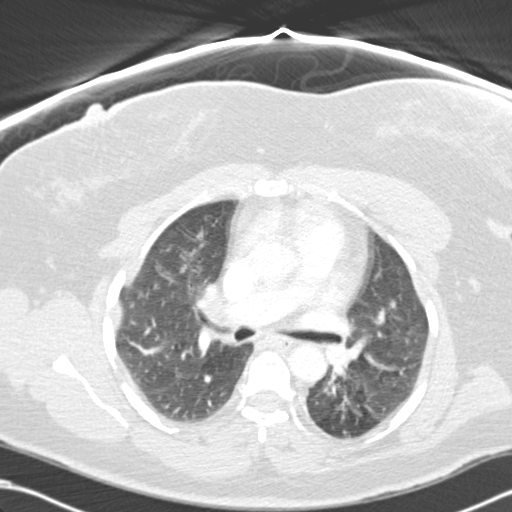
[im 173/266  mediastinal]
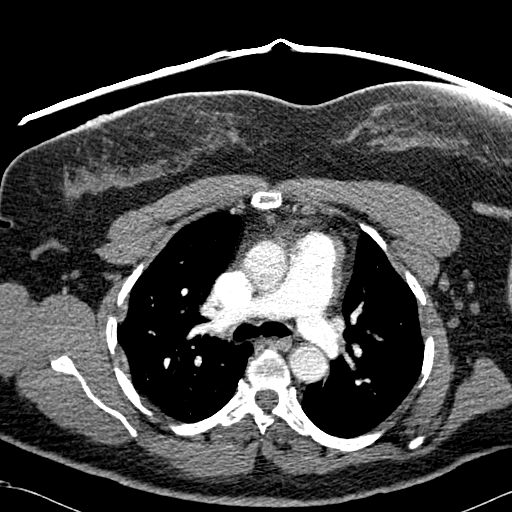
[im 186/266  lung]
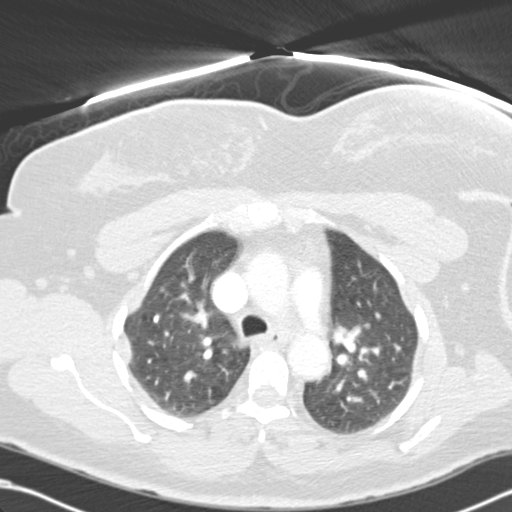
[im 199/266  mediastinal]
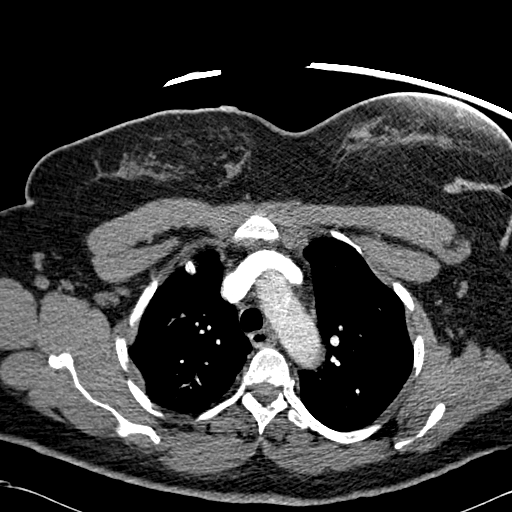
[im 213/266  lung]
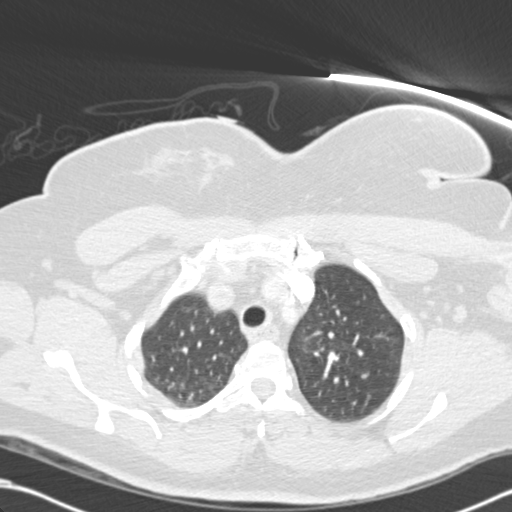
[im 226/266  mediastinal]
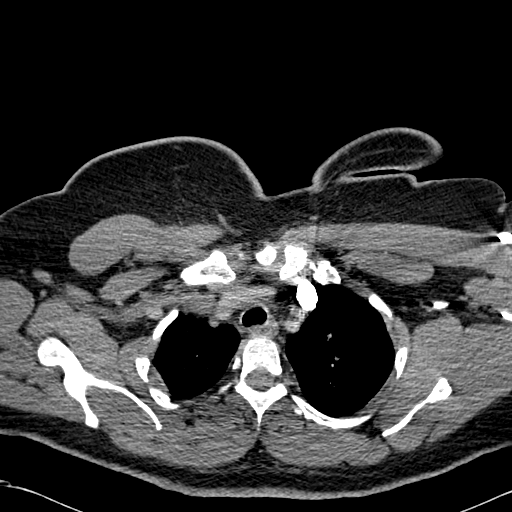
[im 239/266  lung]
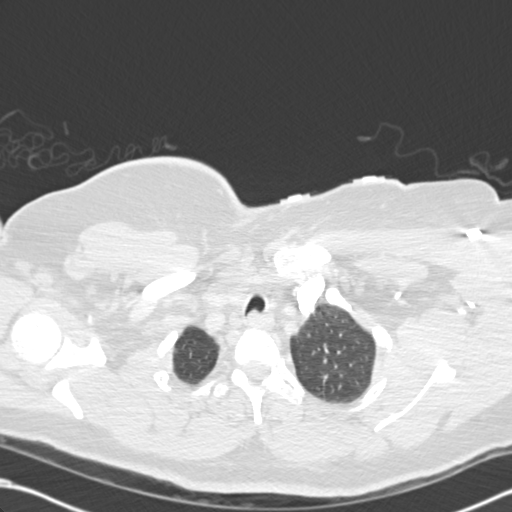
[im 252/266  mediastinal]
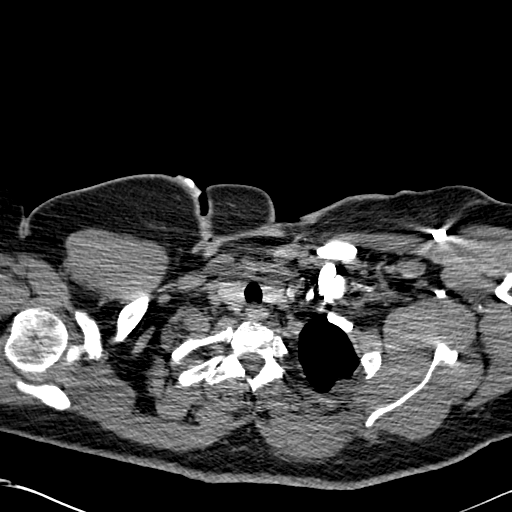

[Series 602: <mpr thick range> · coronal · 0.68mm/px · 1 of 118 slices shown]
[im 59/118  mediastinal]
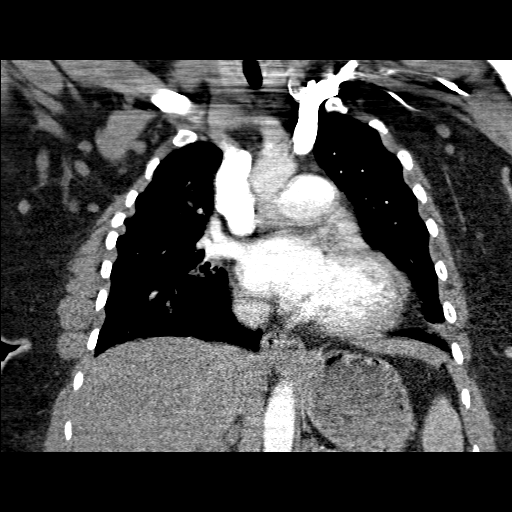

[19 of 36 positions shown; findings below may reference images not displayed]

FINDINGS: There is no pleural or pericardial fluid.  There are a
few small calcified granulomas peripherally in the right middle
lobe.  The lungs are otherwise clear.  There are no pulmonary
emboli.  No aortic pathology is evident.  No coronary artery
calcification is seen.  No hilar or mediastinal adenopathy.  Scans
in the upper abdomen are unremarkable.

Review of the MIP images confirms the above findings.
IMPRESSION: No pulmonary emboli.  No other active chest disease.  Few small
calcified granulomas in the right middle lobe.

## 2011-03-06 ENCOUNTER — Other Ambulatory Visit: Payer: Self-pay | Admitting: *Deleted

## 2011-03-06 DIAGNOSIS — E785 Hyperlipidemia, unspecified: Secondary | ICD-10-CM

## 2011-03-06 MED ORDER — ATORVASTATIN CALCIUM 10 MG PO TABS: 10.0000 mg | ORAL_TABLET | Freq: Every day | ORAL | Status: DC

## 2011-03-06 MED ORDER — QUINAPRIL HCL 40 MG PO TABS: 40.0000 mg | ORAL_TABLET | Freq: Every day | ORAL | Status: DC

## 2011-03-06 NOTE — Telephone Encounter (Signed)
Only one refill on quinapril because she needs an  app't and labs

## 2011-03-07 NOTE — Telephone Encounter (Signed)
Refill fro Accupril 40 mg tablets and Lipitor 10 mg tablets was called to the [facility].  Angelina Ok, RN 03/07/2011 2:57 PM.

## 2011-03-09 ENCOUNTER — Ambulatory Visit (INDEPENDENT_AMBULATORY_CARE_PROVIDER_SITE_OTHER): Payer: Self-pay | Admitting: Internal Medicine

## 2011-03-09 ENCOUNTER — Encounter: Payer: Self-pay | Admitting: Internal Medicine

## 2011-03-09 VITALS — BP 141/79 | HR 99 | Temp 98.6°F | Wt 280.8 lb

## 2011-03-09 DIAGNOSIS — J40 Bronchitis, not specified as acute or chronic: Secondary | ICD-10-CM

## 2011-03-09 DIAGNOSIS — E1149 Type 2 diabetes mellitus with other diabetic neurological complication: Secondary | ICD-10-CM

## 2011-03-09 DIAGNOSIS — E785 Hyperlipidemia, unspecified: Secondary | ICD-10-CM

## 2011-03-09 DIAGNOSIS — E119 Type 2 diabetes mellitus without complications: Secondary | ICD-10-CM

## 2011-03-09 NOTE — Progress Notes (Signed)
Subjective:   Patient ID: Regina Stevenson female   DOB: [birth year] 56 y.o.   MRN: [id number]  HPI:  The patient is a 56 year old female with PMH of diabetes mellitus, hyperlipidemia, obesity, hypertension, allergic rhinitis and GERD, who presents for a follow up visit for her recent ED visit due to acute bronchitis and asthma exacerbation. Patient recently visit ED because of cough and SOB on 02/27/11. Her CXR showed chronic mild interstitial prominence without infiltration.  She was treated with 5 days of prednisone for bronchitis. She completed her prednisone treatment. Her symptoms improved and she feels better, but still has minimal cough and SOB. No fever or chills. No chest pain. Feels mild nasal congestion.  Regarding her DM-II, she is taking Humulin N  (NPH) 50 U in the morning and 45 U in the evening. She did not measure her CBG at home recently. She did not bring her meter in today. She has had no hypoglycemic events over the last few months.  Denies fever, chills, abdominal pain,diarrhea, constipation, dysuria, urgency, frequency, hematuria, joint pain or leg swelling.   Past Medical History  Diagnosis Date  . Diabetic gastroparesis associated with type 2 diabetes mellitus   . Diabetes mellitus     HgA1c 8.1 in 12/2009  . Hyperlipidemia   . Hypertension   . Allergic rhinitis   . Obstructive sleep apnea     Moderately severe obstructive sleep apnea/hypopnea syndrome per Sleep study 04/2009. Patient declined CPAP use (although recommended) secondary to claustrophobia. This did resolved after tonsillectomy in 2005.  Marland Kitchen Fatty liver disease, nonalcoholic   . Obesity   . Cholelithiasis     no symptoms   Current Outpatient Prescriptions  Medication Sig Dispense Refill  . albuterol-ipratropium (COMBIVENT) 18-103 MCG/ACT inhaler Inhale 4 puffs into the lungs 4 (four) times daily as needed for wheezing.  3 Inhaler  3  . amLODipine (NORVASC) 10 MG tablet Take 1 tablet (10 mg total) by  mouth daily.  90 tablet  4  . atorvastatin (LIPITOR) 10 MG tablet Take 1 tablet (10 mg total) by mouth daily.  31 tablet  6  . atorvastatin (LIPITOR) 10 MG tablet Take 1 tablet (10 mg total) by mouth daily.  31 tablet  6  . glucose blood (FREESTYLE LITE) test strip Use as instructed  100 each  12  . insulin NPH (HUMULIN N PEN) 100 UNIT/ML injection Inject 50 units in the morning and 45 units in the evening.  90 mL  3  . Lancets (FREESTYLE) lancets Use as instructed  100 each  12  . mometasone (NASONEX) 50 MCG/ACT nasal spray Place 1 spray into the nose daily.  17 g  3  . pantoprazole (PROTONIX) 40 MG tablet Take 1 tablet (40 mg total) by mouth daily.  90 tablet  1  . quinapril (ACCUPRIL) 40 MG tablet Take 1 tablet (40 mg total) by mouth at bedtime.  30 tablet  0   Family History  Problem Relation Age of Onset  . Coronary artery disease Mother     MI age 23yo, deceased  . Diabetes Mother   . Heart attack Mother   . Coronary artery disease Father     MI age 33s, deceased  . Heart attack Father   . Diabetes type II    . Hypertension    . Breast cancer     History   Social History  . Marital Status: Divorced    Spouse Name: N/A    Number of  Children: N/A  . Years of Education: N/A   Social History Main Topics  . Smoking status: Never Smoker   . Smokeless tobacco: Never Used  . Alcohol Use: No  . Drug Use: No  . Sexually Active: No   Other Topics Concern  . None   Social History Narrative   Nonsmoker, no drug use, no alcohol use. DivorcedHome maker, caring for 1 foster child   Review of Systems:  General: no fevers, chills Skin: no rash HEENT: no blurry vision, hearing changes. Has sore throat Pulm:  Coughing and mild SOB CV: no chest pain, palpitations,  Abd: no nausea/vomiting, abdominal pain, diarrhea/constipation GU: no dysuria, hematuria, polyuria Ext: no arthralgias, myalgias Neuro: no, numbness, or tingling   Objective:  Physical Exam: Filed Vitals:    03/09/11 1055  BP: 141/79  Pulse: 99  Temp: 98.6 F (37 C)  TempSrc: Oral  Weight: 280 lb 12.8 oz (127.37 kg)   General: resting in bed, not in acute distress HEENT: PERRL, EOMI, no scleral icterus Cardiac: S1/S2, RRR, No murmurs, gallops or rubs Pulm: Good air movement bilaterally, Clear to auscultation bilaterally, No rales, wheezing, rhonchi or rubs. Abd: Soft,  nondistended, nontender, no rebound pain, no organomegaly, BS present Ext: No rashes or edema, 2+DP/PT pulse bilaterally Neuro: alert and oriented X3, cranial nerves II-XII grossly intact, muscle strength 5/5 in all extremeties,  sensation to light touch intact.    Assessment & Plan:   #. Bronchitis: improving. She completed 5 days of prednisone treatment. She feels better now, with minimal cough and SOB. Lung auscultation is clear without rales, wheezing or rhonchi. Will continue her Combivent inhaler and Nasonex and follow up.  # HLD: last LDL was 154 on 09/22/10. She is on Lipitor 10 mg daily. Will check her lipid panel and CMET. Will make adjustment depending on these results.  # DM-II: her A1c is 9.1 today. Patient refused to see diabetic educator today. She is taking Humulin N  (NPH) 50 U in the morning and 45 U in the evening. She did not measure her CBG at home recently. she is instructed to measure her CBG 4 times a day (before each meals and at bed time) and come back in a week. Will make adjustment of her insulin dose depending the CBG results.    Lorretta Harp

## 2011-03-09 NOTE — Patient Instructions (Signed)
1. Please measure your blood sugar 4 times a day (before each meal and at bed time). Please come back in a week to 10 days. 2. Please take all medications as prescribed.  3. If you have worsening of your symptoms or new symptoms arise, please call the clinic ([phone]), or go to the ER immediately if symptoms are severe. 4.

## 2011-03-10 LAB — COMPREHENSIVE METABOLIC PANEL
ALT: 46 U/L — ABNORMAL HIGH (ref 0–35)
BUN: 18 mg/dL (ref 6–23)
CO2: 25 mEq/L (ref 19–32)
Calcium: 10 mg/dL (ref 8.4–10.5)
Chloride: 95 mEq/L — ABNORMAL LOW (ref 96–112)
Creat: 1.17 mg/dL — ABNORMAL HIGH (ref 0.50–1.10)
Glucose, Bld: 263 mg/dL — ABNORMAL HIGH (ref 70–99)
Total Bilirubin: 0.4 mg/dL (ref 0.3–1.2)

## 2011-03-10 LAB — LIPID PANEL
Cholesterol: 184 mg/dL (ref 0–200)
HDL: 50 mg/dL (ref >39)
Total CHOL/HDL Ratio: 3.7 Ratio
VLDL: 38 mg/dL (ref 0–40)

## 2011-03-13 ENCOUNTER — Encounter: Payer: Self-pay | Admitting: Internal Medicine

## 2011-03-16 ENCOUNTER — Encounter: Payer: Self-pay | Admitting: Internal Medicine

## 2011-03-16 NOTE — Progress Notes (Signed)
I saw patient and discussed her care with resident Dr. Niu.  I agree with the clinical findings and plans as outlined in his note. 

## 2011-04-03 ENCOUNTER — Other Ambulatory Visit: Payer: Self-pay | Admitting: *Deleted

## 2011-04-03 MED ORDER — QUINAPRIL HCL 40 MG PO TABS: 40.0000 mg | ORAL_TABLET | Freq: Every day | ORAL | Status: DC

## 2011-04-03 NOTE — Telephone Encounter (Signed)
Accupril refill - rxrequest form faxed to [facility].

## 2011-04-16 ENCOUNTER — Ambulatory Visit (INDEPENDENT_AMBULATORY_CARE_PROVIDER_SITE_OTHER): Payer: Self-pay | Admitting: Internal Medicine

## 2011-04-16 ENCOUNTER — Encounter: Payer: Self-pay | Admitting: Internal Medicine

## 2011-04-16 DIAGNOSIS — R609 Edema, unspecified: Secondary | ICD-10-CM

## 2011-04-16 DIAGNOSIS — J3489 Other specified disorders of nose and nasal sinuses: Secondary | ICD-10-CM

## 2011-04-16 DIAGNOSIS — E1149 Type 2 diabetes mellitus with other diabetic neurological complication: Secondary | ICD-10-CM

## 2011-04-16 DIAGNOSIS — E785 Hyperlipidemia, unspecified: Secondary | ICD-10-CM

## 2011-04-16 DIAGNOSIS — J4520 Mild intermittent asthma, uncomplicated: Secondary | ICD-10-CM | POA: Insufficient documentation

## 2011-04-16 DIAGNOSIS — I10 Essential (primary) hypertension: Secondary | ICD-10-CM

## 2011-04-16 DIAGNOSIS — R600 Localized edema: Secondary | ICD-10-CM | POA: Insufficient documentation

## 2011-04-16 DIAGNOSIS — J45909 Unspecified asthma, uncomplicated: Secondary | ICD-10-CM

## 2011-04-16 DIAGNOSIS — K3184 Gastroparesis: Secondary | ICD-10-CM

## 2011-04-16 MED ORDER — MOMETASONE FUROATE 50 MCG/ACT NA SUSP: 1.0000 | Freq: Every day | NASAL | Status: DC

## 2011-04-16 MED ORDER — LORATADINE 10 MG PO CAPS: ORAL_CAPSULE | ORAL | Status: DC

## 2011-04-16 MED ORDER — PSEUDOEPHEDRINE HCL ER 120 MG PO TB12: 120.0000 mg | ORAL_TABLET | Freq: Two times a day (BID) | ORAL | Status: DC | PRN

## 2011-04-16 MED ORDER — BENZONATATE 100 MG PO CAPS: 100.0000 mg | ORAL_CAPSULE | Freq: Three times a day (TID) | ORAL | Status: DC | PRN

## 2011-04-16 MED ORDER — AMOXICILLIN-POT CLAVULANATE 875-125 MG PO TABS: 1.0000 | ORAL_TABLET | Freq: Two times a day (BID) | ORAL | Status: AC

## 2011-04-16 NOTE — Assessment & Plan Note (Signed)
Unlikely acute exacerbation therefore, steroids not indicated. She only has mild expiratory wheezing on physical exam. Her O2 sat was 100% on room air. Patient was not in any respiratory distress. She reports that she only has occasional shortness of breath in which she does use her Combivent inhaler. When asked, patient does not have symptoms on a daily basis nor does it wake her at night. She states that it only worsen whenever she has a cold. With her occasional symptoms, I do not think that she needs to be on a long-acting corticosteroid/beta 2-agonist at this time. She reports that she has been taking Singulair but is not noticed any improvement. -Will start Claritin 10 mg by mouth daily -Continue Nasonex -Continue Combivent inhaler -If patient continues to have persistent symptoms, she may benefit from Advair daily -Will treat her sinusitis with Augmentin

## 2011-04-16 NOTE — Assessment & Plan Note (Signed)
Not well-controlled. Her goal would be less than 130/80. Her repeated Blood pressure was 149/96. I discussed he option of adding an HCTZ which would also help with the swelling and to stop the Norvasc however patient would prefer to try leg elevations first prior to adding another blood pressure agent. -Will continue quinapril 40 mg by mouth daily -Will continue Norvasc 10 mg by mouth daily -Consider adding HCTZ if her blood pressure continues to be elevated -Leg elevation above heart level

## 2011-04-16 NOTE — Assessment & Plan Note (Addendum)
She does have a history of sinusitis as well as sinus surgery. With her current symptoms of maxillary and frontal pressure/tenderness which is consistent with my clinical exam. She also has some cervical lymph nodes enlargement which indicates infection. -Will treat with Augmentin 875 g by mouth twice a day x14 days -Continue Nasonex 2 spray daily in each nostril -Patient may take Sudafed over-the-counter for decongestant -Start Claritin 10 mg by mouth daily -Encouraged patient to use negative pot to rinse her nose with saline prior to using her Nasonex for better penetration -Followup in 2 weeks if symptoms worsen or not improve, may need to go back to ENT  Case discussed with attending, Dr. Josem Kaufmann

## 2011-04-16 NOTE — Progress Notes (Signed)
History of present illness: [name] is a 56 year old woman with past medical history of diabetes type 2, diabetic gastroparesis, hyperlipidemia, hypertension, obstructive sleep apnea, and GERD, asthma presents for upper respiratory symptoms x 1 week in duration. She reports postnasal drainage, with a mild subjective fever of 99 but not actually measuring it. She has a cough with white productive sputum. She also has a headache, sinus pressure in the maxillary and frontal areas along with some left ear pain. She denies any nasal drainage or watery eyes. She has been using Singulair, Nasonex, Combivent. She reports that Singulair does not really help her as much. She endorses have some shortness of breath in the past one month along with bilateral leg swelling which is worse toward the end of the day. She reported having an echocardiogram in 2011 which showed normal EF of 60-65%. She also has some pleuritic chest pain with the coughing but now has resolved. As for her diabetes, patient only checks her blood sugar occasionally. I did review her meter today range from 65-176. She is currently taking Humulin 45 units in the morning and 40 units at night. She reports medication compliance.  Review of system: As per history of present illness  Physical examination: General: alert, well-developed, and cooperative to examination.  ENT: Nose: Erythematous nasal mucosa, no purulent drainage noted. No exudate or erythematous on oral pharynx. Patient had a history of sinus surgery and now without a uvula and tonsils.  Ear exam: Normal tympanic membrane bilaterally.++ Maxillary and frontal sinuses tenderness on palpation. Enlarged cervical lymph nodes under mandible  bilaterally ~1cm Lungs: Mild wheezing on expiration, no crackles. Normal breath sounds bilaterally, no rhonchi. No accessory muscle use  Heart: normal rate, regular rhythm, no murmur, no gallop, and no rub.  Abdomen: soft, non-tender, normal bowel  sounds, no distention, no guarding, no rebound tenderness Msk: no joint swelling, no joint warmth, and no redness over joints.  Pulses: 2+ DP/PT pulses bilaterally Extremities: No cyanosis, clubbing, +2 nonpitting edema Neurologic: alert & oriented X3, cranial nerves II-XII intact, strength normal in all extremities, sensation intact to light touch, and gait normal.

## 2011-04-16 NOTE — Assessment & Plan Note (Signed)
Differential diagnoses include medication induced (Norvasc), venous insufficiency, congestive heart failure. CHF is less likely as patient did have a 2-D echocardiogram in 2011 with a normal EF of 60-65%. There is no diastolic failure component.  I suggested stopping the Norvasc to see if it would improve her symptoms however patient would still like to continue this medication for her hypertension. -Will try leg elevation above heart level  -Will consider stopping Norvasc in the future -If patient continues to have shortness of breath, she may need a repeat 2-D echo to evaluate her structural heart disease

## 2011-04-16 NOTE — Assessment & Plan Note (Addendum)
Hemoglobin A1c was 9.1 in February of 2013 Improving. I did review her meter today ranging from 65-176. I will not make any changes to her regimen today. -Will continue current regimen -Continue ACE inhibitor, creatinine is 1.17 -Urine Microalbumin creatinine ratio was 11.2

## 2011-04-16 NOTE — Patient Instructions (Signed)
Start taking Augmentin 875 mg one tablet twice daily x 14 days Start taking Claritin 10mg  one tablet daily You may take Sudafed one tablet daily Continue using Nasonex nasal spray 2 sprays daily into each nostril Start taking Tessalon Pearl as prescribed Follow up with Dr. Anselm Jungling in 2 weeks if symptoms do not get better

## 2011-04-16 NOTE — Assessment & Plan Note (Signed)
Recent lipid panel showed an LDL of 96. Although goal twice a day allergic her LDL is less than 70 given her a metabolic disorder. However she does have an elevated alkaline phosphatase as well as an ALT. Patient did have a biopsy of the liver a few years ago which showed fatty liver per patient. -Will continue Lipitor 10 mg at this time and not increase to a higher dosage given her abnormal LFTs -Will need to continue to monitor her LFTs every 3-6 months

## 2011-04-26 ENCOUNTER — Telehealth: Payer: Self-pay | Admitting: *Deleted

## 2011-04-26 ENCOUNTER — Encounter: Payer: Self-pay | Admitting: Internal Medicine

## 2011-04-26 ENCOUNTER — Ambulatory Visit (INDEPENDENT_AMBULATORY_CARE_PROVIDER_SITE_OTHER): Payer: Self-pay | Admitting: Internal Medicine

## 2011-04-26 ENCOUNTER — Other Ambulatory Visit: Payer: Self-pay | Admitting: Internal Medicine

## 2011-04-26 VITALS — BP 150/90 | HR 97 | Temp 97.7°F | Ht 67.0 in | Wt 291.6 lb

## 2011-04-26 DIAGNOSIS — E119 Type 2 diabetes mellitus without complications: Secondary | ICD-10-CM

## 2011-04-26 DIAGNOSIS — E785 Hyperlipidemia, unspecified: Secondary | ICD-10-CM

## 2011-04-26 DIAGNOSIS — I10 Essential (primary) hypertension: Secondary | ICD-10-CM

## 2011-04-26 DIAGNOSIS — R609 Edema, unspecified: Secondary | ICD-10-CM

## 2011-04-26 DIAGNOSIS — R600 Localized edema: Secondary | ICD-10-CM

## 2011-04-26 DIAGNOSIS — K219 Gastro-esophageal reflux disease without esophagitis: Secondary | ICD-10-CM

## 2011-04-26 DIAGNOSIS — J302 Other seasonal allergic rhinitis: Secondary | ICD-10-CM

## 2011-04-26 LAB — COMPREHENSIVE METABOLIC PANEL
Alkaline Phosphatase: 198 U/L — ABNORMAL HIGH (ref 39–117)
BUN: 14 mg/dL (ref 6–23)
Creat: 1.13 mg/dL — ABNORMAL HIGH (ref 0.50–1.10)
Glucose, Bld: 159 mg/dL — ABNORMAL HIGH (ref 70–99)
Sodium: 139 mEq/L (ref 135–145)
Total Bilirubin: 0.5 mg/dL (ref 0.3–1.2)

## 2011-04-26 MED ORDER — LORATADINE 10 MG PO CAPS: ORAL_CAPSULE | ORAL | Status: DC

## 2011-04-26 MED ORDER — PANTOPRAZOLE SODIUM 40 MG PO TBEC: 40.0000 mg | DELAYED_RELEASE_TABLET | Freq: Every day | ORAL | Status: DC

## 2011-04-26 MED ORDER — DESLORATADINE 5 MG PO TABS: 5.0000 mg | ORAL_TABLET | Freq: Every day | ORAL | Status: DC

## 2011-04-26 MED ORDER — HYDROCHLOROTHIAZIDE 25 MG PO TABS: 25.0000 mg | ORAL_TABLET | Freq: Every day | ORAL | Status: DC

## 2011-04-26 NOTE — Patient Instructions (Signed)
Please take any medication hydrochlorothiazide for blood pressure control

## 2011-04-26 NOTE — Telephone Encounter (Signed)
Pt calls and states insurance will not pay for loratidine, please change and also change med list thanks, h.

## 2011-04-26 NOTE — Telephone Encounter (Signed)
Pt calls back and states insurance will pay for clarinex 5mg  1 tab daily

## 2011-04-26 NOTE — Assessment & Plan Note (Addendum)
Patient complains of persistent lower extremity edema, described the pain as being worse during the day and not relieved by leg elevation, also patient complains of dyspnea on exertion which makes cardiac etiology more likely, note last 2-D echo was in 2011 which was essentially normal, however given this new dyspnea on exertion we'll order repeat 2-D echo. We'll also check a proBNP today, along with complete metabolic profile and a urine microalbumin/creatinine ratio. We'll also start the patient on hydrochlorothiazide for blood pressure control and symptomatic treatment of her lower extremity edema

## 2011-04-26 NOTE — Progress Notes (Signed)
Patient ID: Regina Stevenson, female   DOB: 11/03/1955, 56 y.o.   MRN: [id number]  HPI:   Patient is a 56 year old female with a past medical history listed below, presents to the outpatient clinic with complaints of lower extremity edema and dyspnea on exertion for the past several weeks, reports that there has not been any changes in her medications. As for her lower extremity edema has been present for approximately one month not affected with leg elevation, and worse with walking. Patient denies any chest pain or shortness of breath, also denies fever chills nausea or vomiting. No other complaints today  Review of Systems: Negative except per history of present illness  Physical Exam:  Nursing notes and vitals reviewed General:  alert, well-developed, and cooperative to examination.   Lungs:  normal respiratory effort, no accessory muscle use, normal breath sounds, no crackles, and no wheezes. Heart:  normal rate, regular rhythm, no murmurs, no gallop, and no rub.   Abdomen:  soft, non-tender, normal bowel sounds, no distention, no guarding, no rebound tenderness, no hepatomegaly, and no splenomegaly.   Extremities:  No cyanosis, clubbing, 2+ LE pitting edema Neurologic:  alert & oriented X3, nonfocal exam  Meds: Medications Prior to Admission  Medication Sig Dispense Refill  . albuterol-ipratropium (COMBIVENT) 18-103 MCG/ACT inhaler Inhale 4 puffs into the lungs 4 (four) times daily as needed for wheezing.  3 Inhaler  3  . amLODipine (NORVASC) 10 MG tablet Take 1 tablet (10 mg total) by mouth daily.  90 tablet  4  . amoxicillin-clavulanate (AUGMENTIN) 875-125 MG per tablet Take 1 tablet by mouth 2 (two) times daily.  28 tablet  0  . atorvastatin (LIPITOR) 10 MG tablet Take 1 tablet (10 mg total) by mouth daily.  31 tablet  6  . benzonatate (TESSALON PERLES) 100 MG capsule Take 1 capsule (100 mg total) by mouth 3 (three) times daily as needed for cough.  30 capsule  0  . glucose blood  (FREESTYLE LITE) test strip Use as instructed  100 each  12  . insulin NPH (HUMULIN N PEN) 100 UNIT/ML injection Inject 50 units in the morning and 45 units in the evening.  90 mL  3  . Lancets (FREESTYLE) lancets Use as instructed  100 each  12  . mometasone (NASONEX) 50 MCG/ACT nasal spray Place 1 spray into the nose daily.  17 g  3  . pseudoephedrine (SUDAFED) 120 MG 12 hr tablet Take 1 tablet (120 mg total) by mouth 2 (two) times daily as needed for congestion.  30 tablet  0  . quinapril (ACCUPRIL) 40 MG tablet Take 1 tablet (40 mg total) by mouth at bedtime.  30 tablet  6  . DISCONTD: pantoprazole (PROTONIX) 40 MG tablet Take 1 tablet (40 mg total) by mouth daily.  90 tablet  1   No current facility-administered medications on file as of 04/26/2011.    Allergies: Codeine and Oxycodone-acetaminophen Past Medical History  Diagnosis Date  . Diabetic gastroparesis associated with type 2 diabetes mellitus   . Diabetes mellitus     HgA1c 8.1 in 12/2009  . Hyperlipidemia   . Hypertension   . Allergic rhinitis   . Obstructive sleep apnea     Moderately severe obstructive sleep apnea/hypopnea syndrome per Sleep study 04/2009. Patient declined CPAP use (although recommended) secondary to claustrophobia. This did resolved after tonsillectomy in 2005.  Marland Kitchen Fatty liver disease, nonalcoholic   . Obesity   . Cholelithiasis     no  symptoms   Past Surgical History  Procedure Date  . Abdominal hysterectomy 05/2001    Supracervical abdominal hysterectomy with lysis  . Ganglion cyst     injected in 11/06, R wrist  . Pap smear     lgsil in 2007  . Tonsillectomy     2005, with partial remnant, L posterior oropharnyx   Family History  Problem Relation Age of Onset  . Coronary artery disease Mother     MI age 83yo, deceased  . Diabetes Mother   . Heart attack Mother   . Coronary artery disease Father     MI age 71s, deceased  . Heart attack Father   . Diabetes type II    . Hypertension      . Breast cancer     History   Social History  . Marital Status: Divorced    Spouse Name: N/A    Number of Children: N/A  . Years of Education: N/A   Occupational History  . Not on file.   Social History Main Topics  . Smoking status: Never Smoker   . Smokeless tobacco: Never Used  . Alcohol Use: No  . Drug Use: No  . Sexually Active: No   Other Topics Concern  . Not on file   Social History Narrative   Nonsmoker, no drug use, no alcohol use. DivorcedHome maker, caring for 1 foster child

## 2011-04-26 NOTE — Progress Notes (Signed)
Pt aware of appt [facility] 05/10/11 9AM - 2D echo. Stanton Kidney Brenya Taulbee RN 04/26/11 9AM

## 2011-04-26 NOTE — Assessment & Plan Note (Signed)
Blood pressure remains elevated, add hydrochlorothiazide today, followup in one month blood pressure and basic metabolic panel.

## 2011-04-26 NOTE — Assessment & Plan Note (Signed)
Well controlled on current treatment, No new changes made today, Will continue to monitor.   

## 2011-04-26 NOTE — Telephone Encounter (Signed)
Approved, prescription placed  in Epic.

## 2011-04-27 LAB — MICROALBUMIN / CREATININE URINE RATIO
Creatinine, Urine: 87.1 mg/dL
Microalb Creat Ratio: 12.7 mg/g (ref 0.0–30.0)
Microalb, Ur: 1.11 mg/dL (ref 0.00–1.89)

## 2011-05-10 ENCOUNTER — Ambulatory Visit (HOSPITAL_COMMUNITY): Payer: Self-pay

## 2011-05-15 ENCOUNTER — Other Ambulatory Visit: Payer: Self-pay | Admitting: Internal Medicine

## 2011-05-15 DIAGNOSIS — Z1231 Encounter for screening mammogram for malignant neoplasm of breast: Secondary | ICD-10-CM

## 2011-05-16 ENCOUNTER — Ambulatory Visit (INDEPENDENT_AMBULATORY_CARE_PROVIDER_SITE_OTHER): Payer: Self-pay | Admitting: Sports Medicine

## 2011-05-16 ENCOUNTER — Ambulatory Visit (HOSPITAL_COMMUNITY): Payer: Self-pay

## 2011-05-16 VITALS — BP 148/85

## 2011-05-16 DIAGNOSIS — M25569 Pain in unspecified knee: Secondary | ICD-10-CM

## 2011-05-16 MED ORDER — TRAMADOL HCL 50 MG PO TABS: 50.0000 mg | ORAL_TABLET | Freq: Three times a day (TID) | ORAL | Status: DC | PRN

## 2011-05-16 NOTE — Assessment & Plan Note (Addendum)
Injection as above. Formal PT for VMO/PFPS. She does have a history of steatohepatitis so voiding Tylenol, she also has a history of new-onset lower extremity swelling, so will avoid NSAIDs. XR AP, lateral, Zoila Shutter, [employer] views, would start viscosupplementation early if still some cartilage left, as this becomes ineffective when arthritis becomes bone on bone. I think she would also benefit at some point from more supportive shoes, versus orthotics. She does tend significantly overpronate. RTC 6 weeks to see how she's doing, and begin physical therapy enough time to work, and discuss Visco supplementation.

## 2011-05-16 NOTE — Progress Notes (Signed)
  Subjective:    Patient ID: Regina Stevenson, female    DOB: 1955/04/17, 56 y.o.   MRN: [id number]  HPI Regina Stevenson comes in with bilateral knee pain that she has had for years. She has known DJD, and her last injection into her right knee was approximately 5 months ago. She got about 4 months of benefit from this injection, and comes in desiring an additional set of injections. She localizes the pain to the anteromedial joint line, as well as under the kneecap, and notes it's worse when getting up out of a chair, and going up stairs. She gets occasional clicking and catching but no locking popping or buckling.   Review of Systems    No fevers, chills, night sweats, weight loss, chest pain, or shortness of breath.  Social History: Non-smoker. Objective:   Physical Exam General:  Well developed, obese, and in no acute distress. Neuro:  Alert and oriented x3, extra-ocular muscles intact. Skin: Warm and dry, no rashes noted. Respiratory:  Not using accessory muscles, speaking in full sentences. Musculoskeletal: Bilateral Knee: Normal to inspection with no erythema or effusion or obvious bony abnormalities. Palpation medial joint line tenderness on both sides. ROM full in flexion and extension and lower leg rotation. Ligaments with solid consistent endpoints including ACL, PCL, LCL, MCL. Negative Mcmurray's, Apley's, and Thessalonian tests. Painful patellar compression present. Crepitation present with patellar glide. Patellar and quadriceps tendons unremarkable. Hamstring and quadriceps strength is normal.   Procedure: Verbal consent obtained and verified. Time-out conducted. Noted no overlying erythema, induration, or other signs of local infection. Skin prepped in a sterile fashion. Topical analgesic spray: Ethyl chloride. Joint: bilateral knees from a medial approach. Completed without difficulty. Meds: 1 cc Depo-Medrol 40, 4 cc lidocaine into each knee. Pain immediately improved  suggesting accurate placement of the medication. Advised to call if fevers/chills, erythema, induration, drainage, or persistent bleeding.     Assessment & Plan:

## 2011-05-17 ENCOUNTER — Ambulatory Visit (HOSPITAL_COMMUNITY)
Admission: RE | Admit: 2011-05-17 | Discharge: 2011-05-17 | Disposition: A | Discharge status: Home / Self Care | Payer: Self-pay | Source: Ambulatory Visit | Attending: Sports Medicine | Admitting: Sports Medicine

## 2011-05-17 ENCOUNTER — Other Ambulatory Visit: Payer: Self-pay | Admitting: Sports Medicine

## 2011-05-17 ENCOUNTER — Ambulatory Visit (HOSPITAL_COMMUNITY)
Admission: RE | Admit: 2011-05-17 | Discharge: 2011-05-17 | Disposition: A | Discharge status: Home / Self Care | Payer: Self-pay | Source: Ambulatory Visit | Attending: Internal Medicine | Admitting: Internal Medicine

## 2011-05-17 DIAGNOSIS — R52 Pain, unspecified: Secondary | ICD-10-CM

## 2011-05-17 DIAGNOSIS — R600 Localized edema: Secondary | ICD-10-CM

## 2011-05-17 DIAGNOSIS — R0609 Other forms of dyspnea: Secondary | ICD-10-CM | POA: Insufficient documentation

## 2011-05-17 DIAGNOSIS — J449 Chronic obstructive pulmonary disease, unspecified: Secondary | ICD-10-CM | POA: Insufficient documentation

## 2011-05-17 DIAGNOSIS — J4489 Other specified chronic obstructive pulmonary disease: Secondary | ICD-10-CM | POA: Insufficient documentation

## 2011-05-17 DIAGNOSIS — E119 Type 2 diabetes mellitus without complications: Secondary | ICD-10-CM | POA: Insufficient documentation

## 2011-05-17 DIAGNOSIS — R0989 Other specified symptoms and signs involving the circulatory and respiratory systems: Secondary | ICD-10-CM | POA: Insufficient documentation

## 2011-05-17 NOTE — Progress Notes (Signed)
  Echocardiogram 2D Echocardiogram has been performed.  Regina Stevenson A 05/17/2011, 9:08 AM

## 2011-06-05 ENCOUNTER — Ambulatory Visit: Payer: Self-pay | Attending: Sports Medicine | Admitting: Physical Therapy

## 2011-06-05 DIAGNOSIS — IMO0001 Reserved for inherently not codable concepts without codable children: Secondary | ICD-10-CM | POA: Insufficient documentation

## 2011-06-05 DIAGNOSIS — M25569 Pain in unspecified knee: Secondary | ICD-10-CM | POA: Insufficient documentation

## 2011-06-05 DIAGNOSIS — R5381 Other malaise: Secondary | ICD-10-CM | POA: Insufficient documentation

## 2011-06-05 DIAGNOSIS — M6281 Muscle weakness (generalized): Secondary | ICD-10-CM | POA: Insufficient documentation

## 2011-06-07 ENCOUNTER — Ambulatory Visit (HOSPITAL_COMMUNITY)
Admission: RE | Admit: 2011-06-07 | Discharge: 2011-06-07 | Disposition: A | Discharge status: Home / Self Care | Payer: Self-pay | Source: Ambulatory Visit | Attending: Internal Medicine | Admitting: Internal Medicine

## 2011-06-07 DIAGNOSIS — Z1231 Encounter for screening mammogram for malignant neoplasm of breast: Secondary | ICD-10-CM | POA: Insufficient documentation

## 2011-06-12 ENCOUNTER — Ambulatory Visit: Payer: Self-pay | Attending: Internal Medicine

## 2011-06-12 DIAGNOSIS — IMO0001 Reserved for inherently not codable concepts without codable children: Secondary | ICD-10-CM | POA: Insufficient documentation

## 2011-06-12 DIAGNOSIS — M25569 Pain in unspecified knee: Secondary | ICD-10-CM | POA: Insufficient documentation

## 2011-06-12 DIAGNOSIS — R5381 Other malaise: Secondary | ICD-10-CM | POA: Insufficient documentation

## 2011-06-12 DIAGNOSIS — M6281 Muscle weakness (generalized): Secondary | ICD-10-CM | POA: Insufficient documentation

## 2011-06-14 ENCOUNTER — Ambulatory Visit: Payer: Self-pay

## 2011-06-19 ENCOUNTER — Ambulatory Visit: Payer: Self-pay | Admitting: Physical Therapy

## 2011-06-21 ENCOUNTER — Ambulatory Visit: Payer: Self-pay | Admitting: Physical Therapy

## 2011-06-25 ENCOUNTER — Ambulatory Visit (INDEPENDENT_AMBULATORY_CARE_PROVIDER_SITE_OTHER): Payer: Self-pay | Admitting: Sports Medicine

## 2011-06-25 VITALS — BP 164/83

## 2011-06-25 DIAGNOSIS — M25561 Pain in right knee: Secondary | ICD-10-CM

## 2011-06-25 DIAGNOSIS — M25569 Pain in unspecified knee: Secondary | ICD-10-CM

## 2011-06-25 MED ORDER — TRAMADOL HCL 50 MG PO TABS: 50.0000 mg | ORAL_TABLET | Freq: Four times a day (QID) | ORAL | Status: DC | PRN

## 2011-06-25 NOTE — Assessment & Plan Note (Signed)
Patient reports improvement in pain following glucocorticoid injections bilaterally last month.  She does have regular soreness, however, especially since starting physical therapy.  Recommending using Tramadol at least twice daily, up to four times daily as needed.  Will follow-up in about 4 months following last injection (end of July) to see if she needs another injection.  Consider Viscosupplementation if steroids fail to alleviate symptoms for 3-4 months. Currently, they seem to last her about every 5 months.  Recommend exercises that do not require significant knee bending.

## 2011-06-25 NOTE — Patient Instructions (Signed)
Recommend taking Tramadol at least twice a day. You may take up to four times a day as needed.   Follow-up at the end of July.   FOR THERAPIST: -Recommend avoiding exercises with a lot of knee flexion (due to arthritis). Please focus on straight leg exercises.

## 2011-06-25 NOTE — Progress Notes (Signed)
  Subjective:    Patient ID: Regina Stevenson, female    DOB: Mar 30, 1955, 56 y.o.   MRN: [id number]  HPI This is a 56 year old obese female with a history of bilateral knee DJD who presents for follow-up of bilateral knee pain.  She was last seen on 04/10 and received glucocorticoid injections in both knees. She reports improvement in pain since the injections. She denies night-time pain and needing to hop around, which has had to do previously when the pain became severe.  She uses the Tramadol occasionally (not daily).   Today, she reports soreness in her knees. The pain has worsened some since starting physical therapy 2 weeks ago.   Review of Systems    Objective:   Physical Exam  GEN: WDWN, NAD, Non-toxic, Alert & Oriented x 3; obese HEENT: Atraumatic, Normocephalic.  Ears and Nose: No external deformity. EXTR: No clubbing/cyanosis/edema  PSYCH: Normally interactive. Conversant. Not depressed or anxious appearing.  Calm demeanor.   Knee: right and left Effusion: neg; however, difficult to assess due to fatty tissue Chronic DJD changes most along medial aspect with hypertrophy Medial and lateral joint lines: NT McMurray's: neg Knee flexion/extension: 5/5 strength Hip abduction, IR, ER: WNL; no pain Hip flexion str: 5/5    Assessment & Plan:

## 2011-06-26 ENCOUNTER — Encounter: Payer: Self-pay | Admitting: Internal Medicine

## 2011-06-26 ENCOUNTER — Ambulatory Visit: Payer: Self-pay | Admitting: Sports Medicine

## 2011-06-26 ENCOUNTER — Ambulatory Visit (INDEPENDENT_AMBULATORY_CARE_PROVIDER_SITE_OTHER): Payer: Self-pay | Admitting: Internal Medicine

## 2011-06-26 VITALS — BP 130/80 | HR 80 | Temp 97.3°F | Ht 67.0 in | Wt 290.5 lb

## 2011-06-26 DIAGNOSIS — M25569 Pain in unspecified knee: Secondary | ICD-10-CM

## 2011-06-26 DIAGNOSIS — K219 Gastro-esophageal reflux disease without esophagitis: Secondary | ICD-10-CM

## 2011-06-26 DIAGNOSIS — I10 Essential (primary) hypertension: Secondary | ICD-10-CM

## 2011-06-26 DIAGNOSIS — E1149 Type 2 diabetes mellitus with other diabetic neurological complication: Secondary | ICD-10-CM

## 2011-06-26 DIAGNOSIS — K3184 Gastroparesis: Secondary | ICD-10-CM

## 2011-06-26 DIAGNOSIS — M25562 Pain in left knee: Secondary | ICD-10-CM

## 2011-06-26 DIAGNOSIS — Z79899 Other long term (current) drug therapy: Secondary | ICD-10-CM

## 2011-06-26 DIAGNOSIS — E785 Hyperlipidemia, unspecified: Secondary | ICD-10-CM

## 2011-06-26 MED ORDER — PANTOPRAZOLE SODIUM 40 MG PO TBEC: 40.0000 mg | DELAYED_RELEASE_TABLET | Freq: Every day | ORAL | Status: DC

## 2011-06-26 NOTE — Assessment & Plan Note (Signed)
Patient is currently taking NPH twice daily. This has seemed to do well for her diabetes she has gone from hemoglobin A1c of 9 to 7.5. I did encourage her and congratulate her on this and encourage her strongly to continue trying to lose weight to bring that number down to goal. We'll see her back in 3 months for repeat hemoglobin A1c and check on her weight loss.

## 2011-06-26 NOTE — Assessment & Plan Note (Signed)
Last lipid panel at goal with Lipitor. Will continue.

## 2011-06-26 NOTE — Assessment & Plan Note (Signed)
Has degenerative joint disease in both knees, is using tramadol for pain control and doing physical therapy. At the sports medicine clinic has injected both knees with in the last month and does do these injections every 4-5 months.

## 2011-06-26 NOTE — Assessment & Plan Note (Signed)
Patient uses Combivent as needed, states that she does not use it very regularly. It does help her when she does have to use it.

## 2011-06-26 NOTE — Patient Instructions (Signed)
You were seen today for a check up. Your diabetes are doing much better, keep going on the exercise! Your blood pressure is good today so keep working on medicines and diet. We have given you our social worker's card and you can stop in a talk to her about resources for weight loss in the community. Losing weight will help your diabetes and your blood pressure and your knees. We will see you back in 3 months to check on your weight and your diabetes. If you have any questions or need anything sooner please call us at [phone].

## 2011-06-26 NOTE — Progress Notes (Signed)
Subjective:     Patient ID: Regina Stevenson, female   DOB: [birth year], 56 y.o.   MRN: [id number]  HPI The patient is a 56 year old female who comes in today for a followup visit of her diabetes, hyperlipidemia, osteoarthritis of her knees, asthma, hypertension. Her hemoglobin A1c is 7.5 at today's visit and she states that she has been taking her medications as prescribed and she has been trying to exercise. She is limited by pain in her knees. She states that now they have told her that there is "bone-on-bone". She is doing physical therapy for her knee and I did advise water therapy however she states that the chlorine agitates her asthma. Her asthma has been very well controlled and she has not used her inhaler that often. She is interested in weight loss and states that she would never have gastric bypass as her brother had that and she felt it was horrific however she would be interested in lap banding. We did discuss the procedure briefly. No chest pain, shortness of breath, nausea, vomiting, diarrhea.  Review of Systems  Vitals: Blood pressure: 130/80 Pulse: 80    Objective:   Physical Exam     Assessment/Plan:   1. Please see problem-oriented charting.  2. Disposition-patient will be seen back in 3 months for followup of her diabetes, hypertension, weight. Have provided her with Jarold Song our social workers number so that she can come see her and talk about weight loss resources in the community. She was interested in weight loss surgery however they did not take orange card so that will not be an option at this time. Did offer nutritional counseling and she did not seem interested. No change to her medical regimen at today's visit. Advised her to continue exercising and to work on her knee pain. Did advise her to go back and get an additional eye exam for her diabetes.

## 2011-06-26 NOTE — Assessment & Plan Note (Signed)
Blood pressure was at goal today 130/80. No change to regimen. We'll recheck in 3 months.

## 2011-06-26 NOTE — Assessment & Plan Note (Signed)
Does continue to take Protonix daily which helps. She does request refill on that today and did provide that for her.

## 2011-06-27 ENCOUNTER — Ambulatory Visit: Payer: Self-pay | Admitting: Physical Therapy

## 2011-07-03 ENCOUNTER — Ambulatory Visit: Payer: Self-pay | Admitting: Physical Therapy

## 2011-07-05 ENCOUNTER — Ambulatory Visit: Payer: Self-pay | Admitting: Physical Therapy

## 2011-07-05 ENCOUNTER — Other Ambulatory Visit: Payer: Self-pay | Admitting: *Deleted

## 2011-07-09 MED ORDER — INSULIN NPH (HUMAN) (ISOPHANE) 100 UNIT/ML ~~LOC~~ SUSP: SUBCUTANEOUS | Status: DC

## 2011-07-10 NOTE — Telephone Encounter (Signed)
Rx faxed in.

## 2011-07-11 ENCOUNTER — Ambulatory Visit: Payer: Self-pay | Attending: Sports Medicine | Admitting: Physical Therapy

## 2011-07-11 ENCOUNTER — Encounter: Payer: Self-pay | Admitting: Physical Therapy

## 2011-07-11 DIAGNOSIS — R5381 Other malaise: Secondary | ICD-10-CM | POA: Insufficient documentation

## 2011-07-11 DIAGNOSIS — M6281 Muscle weakness (generalized): Secondary | ICD-10-CM | POA: Insufficient documentation

## 2011-07-11 DIAGNOSIS — M25569 Pain in unspecified knee: Secondary | ICD-10-CM | POA: Insufficient documentation

## 2011-07-11 DIAGNOSIS — IMO0001 Reserved for inherently not codable concepts without codable children: Secondary | ICD-10-CM | POA: Insufficient documentation

## 2011-07-18 ENCOUNTER — Other Ambulatory Visit: Payer: Self-pay | Admitting: *Deleted

## 2011-07-18 MED ORDER — AMLODIPINE BESYLATE 10 MG PO TABS: 10.0000 mg | ORAL_TABLET | Freq: Every day | ORAL | Status: DC

## 2011-07-18 NOTE — Telephone Encounter (Signed)
Rx faxed in.

## 2011-08-08 ENCOUNTER — Other Ambulatory Visit: Payer: Self-pay | Admitting: *Deleted

## 2011-08-08 MED ORDER — IPRATROPIUM-ALBUTEROL 18-103 MCG/ACT IN AERO: 4.0000 | INHALATION_SPRAY | Freq: Four times a day (QID) | RESPIRATORY_TRACT | Status: DC | PRN

## 2011-08-15 ENCOUNTER — Other Ambulatory Visit: Payer: Self-pay | Admitting: *Deleted

## 2011-08-15 MED ORDER — DESLORATADINE 5 MG PO TABS: 5.0000 mg | ORAL_TABLET | Freq: Every day | ORAL | Status: DC

## 2011-08-15 NOTE — Telephone Encounter (Signed)
Called to pharm 

## 2011-08-16 NOTE — Telephone Encounter (Signed)
Rx faxed in.

## 2011-08-24 ENCOUNTER — Telehealth: Payer: Self-pay | Admitting: *Deleted

## 2011-08-24 MED ORDER — IPRATROPIUM-ALBUTEROL 20-100 MCG/ACT IN AERS: 1.0000 | INHALATION_SPRAY | Freq: Four times a day (QID) | RESPIRATORY_TRACT | Status: DC | PRN

## 2011-08-24 NOTE — Telephone Encounter (Signed)
Fax from [facility].  Combivent Inhaler  1-2 puffs q4-6hr PRN has been changed to  Combivent Respimat  1 puff QID, Maximum of 6 doses in 24 hrs. Need new rx with new directions   Thanks

## 2011-08-24 NOTE — Telephone Encounter (Signed)
Done, thanks.  Dr. Kollar 

## 2011-08-28 ENCOUNTER — Telehealth: Payer: Self-pay | Admitting: *Deleted

## 2011-08-28 NOTE — Telephone Encounter (Signed)
Combivent Respimat rx  per Dr Dorise Hiss called to [facility].

## 2011-10-02 ENCOUNTER — Encounter: Payer: Self-pay | Admitting: Internal Medicine

## 2011-10-02 ENCOUNTER — Ambulatory Visit (INDEPENDENT_AMBULATORY_CARE_PROVIDER_SITE_OTHER): Payer: Self-pay | Admitting: Internal Medicine

## 2011-10-02 VITALS — BP 144/82 | HR 76

## 2011-10-02 DIAGNOSIS — M25569 Pain in unspecified knee: Secondary | ICD-10-CM

## 2011-10-02 DIAGNOSIS — I10 Essential (primary) hypertension: Secondary | ICD-10-CM

## 2011-10-02 DIAGNOSIS — E785 Hyperlipidemia, unspecified: Secondary | ICD-10-CM

## 2011-10-02 DIAGNOSIS — K3184 Gastroparesis: Secondary | ICD-10-CM

## 2011-10-02 DIAGNOSIS — Z79899 Other long term (current) drug therapy: Secondary | ICD-10-CM

## 2011-10-02 DIAGNOSIS — M25561 Pain in right knee: Secondary | ICD-10-CM

## 2011-10-02 DIAGNOSIS — E1149 Type 2 diabetes mellitus with other diabetic neurological complication: Secondary | ICD-10-CM

## 2011-10-02 DIAGNOSIS — M25562 Pain in left knee: Secondary | ICD-10-CM

## 2011-10-02 NOTE — Assessment & Plan Note (Signed)
The patient's blood pressure is 144/82 at today's visit and no change regimen. Advised her to continue taking her medications as prescribed. We'll see her back in one month for close followup.

## 2011-10-02 NOTE — Patient Instructions (Addendum)
We are going to have you check your sugar when you are having chills. Also, work on eating better. We will give you some sample diets. We are not going to change your medicines today but we need to work on your sugars. Start taking lipitor again, your liver is doing good. We will see you back in 1 month. Call us if you need anything before then at [phone].  1800 Calorie Diabetic Diet The 1800 calorie diabetic diet is designed for eating up to 1800 calories each day. Following this diet and making healthy meal choices can help improve overall health. It controls blood glucose (sugar) levels, and it can also help lower blood pressure and cholesterol. SERVING SIZES Measuring foods and serving sizes helps to make sure you are getting the right amount of food. The list below tells how big or small some common serving sizes are:  1 oz.........4 stacked dice.   3 oz........Marland KitchenDeck of cards.   1 tsp.......Marland KitchenTip of little finger.   1 tbs......Marland KitchenMarland KitchenThumb.   2 tbs.......Marland KitchenGolf ball.    cup......Marland KitchenHalf of a fist.   1 cup.......Marland KitchenA fist.  GUIDELINES FOR CHOOSING FOODS The goal of this diet is to eat a variety of foods and limit calories to 1800 each day. This can be done by choosing foods that are low in calories and fat. The diet also suggests eating small amounts of food frequently. Doing this helps control your blood glucose levels so they do not get too high or too low. Each meal or snack may include a protein food source to help you feel more satisfied. Try to eat about the same amount of food around the same time each day. This includes weekend days, travel days, and days off work. Space your meals about 4 to 5 hours apart, and add a snack between them, if you wish.  For example, a daily food plan could include breakfast, a morning snack, lunch, dinner, and an evening snack. Healthy meals and snacks have different types of foods, including whole grains, vegetables, fruits, lean meats, poultry, fish, and  dairy products. As you plan your meals, select a variety of foods. Choose from the bread and starch, vegetable, fruit, dairy, and meat/protein groups. Examples of foods from each group are listed below with their suggested serving sizes. Use measuring cups and spoons to become familiar with what a healthy portion looks like. Bread and Starch Each serving equals 15 grams of carbohydrates.  1 slice bread.    bagel.    cup cold cereal (unsweetened).    cup hot cereal or mashed potatoes.   1 small potato (size of a computer mouse).   ? cup cooked pasta or rice.    English muffin.   1 cup broth-based soup.   3 cups of popcorn.   4 to 6 whole-wheat crackers.    cup cooked beans, peas, or corn.  Vegetables Each serving equals 5 grams of carbohydrates.   cup cooked vegetables.   1 cup raw vegetables.    cup tomato or vegetable juice.  Fruit Each serving equals 15 grams of carbohydrates.  1 small apple or orange.   1  cup watermelon or strawberries.    cup applesauce (no sugar added).   2 tbs raisins.    banana.    cup canned fruit, packed in water or in its own juice.    cup unsweetened fruit juice.  Dairy Each serving equals 12 to 15 grams of carbohydrates.  1 cup fat-free milk.   6  oz artificially sweetened yogurt or plain yogurt.   1 cup low-fat buttermilk.   1 cup soy milk.   1 cup almond milk.  Meat/Protein  1 large egg.   2 to 3 oz meat, poultry, or fish.    cup low-fat cottage cheese.   1 tbs peanut butter.   1 oz low-fat cheese.    cup tuna, packed in water.    cup tofu.  Fat  1 tsp oil.   1 tsp trans-fat-free margarine.   1 tsp butter.   1 tsp mayonnaise.   2 tbs avocado.   1 tbs salad dressing.   1 tbs cream cheese.   2 tbs sour cream.  SAMPLE 1800 CALORIE DIET PLAN Breakfast   cup unsweetened cereal (1 carb serving).   1 cup fat-free milk (1 carb serving).   1 slice whole-wheat toast (1 carb  serving).    small banana (1 carb serving).   1 scrambled egg.   1 tsp trans-fat-free margarine.  Lunch  Tuna sandwich.   2 slices whole-wheat bread (2 carb servings).    cup canned tuna in water, drained.   1 tbs reduced fat mayonnaise.   1 stalk celery, chopped.   2 slices tomato.   1 lettuce leaf.   1 cup carrot sticks.   24 to 30 seedless grapes (2 carb servings).   6 oz light yogurt (1 carb serving).  Afternoon Snack  3 graham cracker squares (1 carb serving).   1 cup fat-free milk (1 carb serving).   1 tbs peanut butter.  Dinner  3 oz salmon, broiled with 1 tsp oil.   1 cup mashed potatoes (2 carb servings) with 1 tsp trans-fat-free margarine.   1 cup fresh or frozen green beans.   1 cup steamed asparagus.   1 cup fat-free milk (1 carb serving).  Evening Snack  3 cups of air-popped popcorn (1 carb serving).   2 tbs Parmesan cheese.  Meal Plan You can use this worksheet to help you make a daily meal plan based on the 1800 calorie diabetic diet suggestions. If you are using this plan to help you control your blood glucose, you may interchange carbohydrate-containing foods (dairy, starches, and fruits). Select a variety of fresh foods of varying colors and flavors. The total amount of carbohydrate in your meals or snacks is more important than making sure you include all of the food groups every time you eat. Choose from the approximate amount of the following foods to build your day's meals:  8 Starches.   4 Vegetables.   3 Fruits.   2 Dairy.   6 to 7 oz Meat/Protein.   Up to 4 Fats.  Your dietician can use this worksheet to help you decide how many servings and which types of foods are right for you. BREAKFAST Food Group and Servings / Food Choice Starches _______________________________________________________ Dairy __________________________________________________________ Fruit  ___________________________________________________________ Meat/Protein ____________________________________________________ Fat ____________________________________________________________ LUNCH Food Group and Servings / Food Choice Starch _________________________________________________________ Meat/Protein ___________________________________________________ Vegetables _____________________________________________________ Fruit __________________________________________________________ Dairy __________________________________________________________ Fat ____________________________________________________________ Aura Fey Food Group and Servings / Food Choice Starch ________________________________________________________ Meat/Protein ___________________________________________________ Fruit __________________________________________________________ Dairy __________________________________________________________ Laural Golden Food Group and Servings / Food Choice Starches _______________________________________________________ Meat/Protein ___________________________________________________ Dairy __________________________________________________________ Vegetable ______________________________________________________ Fruit ___________________________________________________________ Fat ____________________________________________________________ Lollie Sails Food Group and Servings / Food Choice Fruit __________________________________________________________ Meat/Protein ___________________________________________________ Dairy __________________________________________________________ Starch _________________________________________________________ DAILY TOTALS Starches _________________________ Vegetables _______________________ Fruits ____________________________ Dairy ____________________________ Meat/Protein_____________________ Fats  _____________________________ Document Released: 08/14/2004 Document Revised: 01/11/2011 Document Reviewed: 12/08/2010  [employer] Patient Information 2012 [employer].

## 2011-10-02 NOTE — Progress Notes (Signed)
Subjective:     Patient ID: Regina Stevenson, female   DOB: [birth year], 56 y.o.   MRN: [id number]  HPI The patient is a 56 YO female who comes in today for a routine visit with her PCP. She is not having any acute problems, she states that she is due for knee injection from sports medicine and her knees are hurting her a little more than usual. She states that she has been eating more poorly lately and eating more sweets. She has been taking all of her medicines except lipitor as she saw an ad on TV saying it was bad for your liver and she got concerned. Reassured her that her liver is fine and was just checked in March and that she should continue taking it for her cholesterol. She is having some episodes of chills for which she takes ibuprofen and this seems to help. She has not noticed any fevers, rashes, SOB, cough or cold symptoms. No new symptoms or pains. She has not checked her sugars during one of these episodes and they are not any particular time of day. She continues to take her insulin and blood pressure medicines as prescribed. No other complaints.   Review of Systems  Constitutional: Positive for chills. Negative for fever, diaphoresis, activity change, appetite change, fatigue and unexpected weight change.  HENT: Negative.  Negative for hearing loss, ear pain, nosebleeds, congestion, sore throat, facial swelling, rhinorrhea, sneezing, drooling, mouth sores, trouble swallowing, neck pain, neck stiffness, dental problem, voice change, postnasal drip, sinus pressure, tinnitus and ear discharge.   Eyes: Negative.   Respiratory: Negative for cough, chest tightness, shortness of breath and wheezing.   Gastrointestinal: Negative.  Negative for nausea, vomiting, abdominal pain, diarrhea, constipation and abdominal distention.  Musculoskeletal: Positive for arthralgias. Negative for myalgias, back pain, joint swelling and gait problem.  Skin: Negative.   Neurological: Negative for dizziness, tremors,  seizures, syncope, facial asymmetry, speech difficulty, weakness, light-headedness, numbness and headaches.  Hematological: Negative.   Psychiatric/Behavioral: Negative.        Objective:   Physical Exam  Constitutional: She is oriented to person, place, and time. She appears well-developed and well-nourished. No distress.  HENT:  Head: Normocephalic and atraumatic.  Eyes: EOM are normal. Pupils are equal, round, and reactive to light.  Neck: Normal range of motion. Neck supple.  Cardiovascular: Normal rate and regular rhythm.   Pulmonary/Chest: Effort normal and breath sounds normal. No respiratory distress. She has no rales. She exhibits no tenderness.  Abdominal: Soft. Bowel sounds are normal. She exhibits no distension. There is no tenderness. There is no rebound.  Musculoskeletal: Normal range of motion. She exhibits tenderness. She exhibits no edema.       Knee pain  Neurological: She is alert and oriented to person, place, and time. No cranial nerve deficit.  Skin: Skin is warm and dry.  Psychiatric: She has a normal mood and affect. Her behavior is normal. Judgment and thought content normal.       Assessment/Plan:   1. Please see problem oriented charting.   2. Disposition - The patient will be seen back in 1 month for close follow up on her diet. HgA1c worse at today's visit and she was advised to start taking lipitor again.

## 2011-10-02 NOTE — Assessment & Plan Note (Signed)
Patient was previously taking Lipitor however stopped due to TV ad. Did advise her that she is able to take Lipitor and she will start taking it again.

## 2011-10-02 NOTE — Assessment & Plan Note (Signed)
The patient's hemoglobin A1c is 8.3 at today's visit which is increased from 7.5 at last visit 3 months ago. No change to regimen at today's visit. She states that her diet has been fairly bad recently. She would like to try diet modification instead of changing regimen. At next visit in one month we'll talk to her about possibly adding back metformin in addition to her NPH. She is currently on ACE inhibitor. Most recent albumin to creatinine ratio shows no nephrotoxicity from diabetes or microalbuminuria yet.

## 2011-10-02 NOTE — Assessment & Plan Note (Signed)
Patient is getting knee injections at sports medicine in the next couple of days. We'll continue to follow. Otherwise well-controlled.

## 2011-10-03 ENCOUNTER — Ambulatory Visit (INDEPENDENT_AMBULATORY_CARE_PROVIDER_SITE_OTHER): Payer: Self-pay | Admitting: Sports Medicine

## 2011-10-03 VITALS — BP 153/76 | Ht 66.0 in | Wt 280.0 lb

## 2011-10-03 DIAGNOSIS — M25561 Pain in right knee: Secondary | ICD-10-CM

## 2011-10-03 DIAGNOSIS — S134XXA Sprain of ligaments of cervical spine, initial encounter: Secondary | ICD-10-CM

## 2011-10-03 DIAGNOSIS — M25569 Pain in unspecified knee: Secondary | ICD-10-CM

## 2011-10-03 MED ORDER — MELOXICAM 15 MG PO TABS: 15.0000 mg | ORAL_TABLET | Freq: Every day | ORAL | Status: DC

## 2011-10-03 NOTE — Progress Notes (Signed)
  Subjective:    Patient ID: Regina Stevenson, female    DOB: [birth year], 56 y.o.   MRN: [id number]  HPI Rema comes in with bilateral knee pain that she has had for years. She has known DJD, and her last injection into her right knee was approximately 5 months ago. She got about 4 months of benefit from this injection, and comes in desiring an additional set of injections. She localizes the pain to the anteromedial joint line, as well as under the kneecap, and notes it's worse when getting up out of a chair, and going up stairs. She gets occasional clicking and catching but no locking popping or buckling.   Review of Systems    No fevers, chills, night sweats, weight loss, chest pain, or shortness of breath.  Social History: Non-smoker. Objective:   Physical Exam Filed Vitals:   10/03/11 0905  BP: 153/76    General:  Well developed, obese, and in no acute distress. Neuro:  Alert and oriented x3, extra-ocular muscles intact. Skin: Warm and dry, no rashes noted. Respiratory:  Not using accessory muscles, speaking in full sentences. Musculoskeletal: Bilateral Knee: Normal to inspection with no erythema or effusion or obvious bony abnormalities. Palpation medial joint line tenderness on both sides. ROM full in flexion and extension and lower leg rotation. Ligaments with solid consistent endpoints including ACL, PCL, LCL, MCL. Negative Mcmurray's, Apley's, and Thessalonian tests. Painful patellar compression present. Crepitation present with patellar glide. Patellar and quadriceps tendons unremarkable. Hamstring and quadriceps strength is normal.   Procedure: Verbal consent obtained and verified. Time-out conducted. Noted no overlying erythema, induration, or other signs of local infection. Skin prepped in a sterile fashion. Topical analgesic spray: Ethyl chloride. Joint: bilateral knees from a medial approach. Completed without difficulty. Meds: 1 cc Depo-Medrol 40, 4 cc lidocaine  into each knee. Pain immediately improved suggesting accurate placement of the medication. Advised to call if fevers/chills, erythema, induration, drainage, or persistent bleeding.     Assessment & Plan:

## 2011-10-03 NOTE — Assessment & Plan Note (Addendum)
Patient is has had 3 injections in both knees the course of the last 15 months. Discuss that we can continue to do this every 6 months if necessary. If the duration between injections seem to be decreasing we should consider viscous supplementation. Patient will come back in one month. encouraged weight lose as well which would be helpful.

## 2011-10-31 ENCOUNTER — Ambulatory Visit: Payer: Self-pay | Admitting: Sports Medicine

## 2011-11-02 ENCOUNTER — Encounter: Payer: Self-pay | Admitting: Internal Medicine

## 2011-11-02 ENCOUNTER — Ambulatory Visit (INDEPENDENT_AMBULATORY_CARE_PROVIDER_SITE_OTHER): Payer: Self-pay | Admitting: Internal Medicine

## 2011-11-02 ENCOUNTER — Ambulatory Visit: Payer: Self-pay | Admitting: Internal Medicine

## 2011-11-02 VITALS — BP 141/79 | HR 77 | Temp 97.9°F | Ht 69.0 in | Wt 285.6 lb

## 2011-11-02 DIAGNOSIS — E669 Obesity, unspecified: Secondary | ICD-10-CM

## 2011-11-02 DIAGNOSIS — E1149 Type 2 diabetes mellitus with other diabetic neurological complication: Secondary | ICD-10-CM

## 2011-11-02 DIAGNOSIS — I10 Essential (primary) hypertension: Secondary | ICD-10-CM

## 2011-11-02 DIAGNOSIS — F411 Generalized anxiety disorder: Secondary | ICD-10-CM

## 2011-11-02 DIAGNOSIS — F419 Anxiety disorder, unspecified: Secondary | ICD-10-CM

## 2011-11-02 DIAGNOSIS — K3184 Gastroparesis: Secondary | ICD-10-CM

## 2011-11-02 NOTE — Progress Notes (Signed)
Subjective:   Patient ID: Regina Stevenson female   DOB: 05/08/1955 56 y.o.   MRN: [id number]  HPI: [name] is a 56 y.o. african [place] female with PMH of poorly controlled diabetes, HTN, OSA, and obesity presenting today for follow up visit.  She claims she was last seen by Dr. Dorise Hiss in August when she was suffering from occasional episodes of chills which have since then resolved.  At that visit, she received diabetic education and a diet plan to help lose weight.  She did not follow diet much claiming she has had multiple recent stresses in her life lately and does not have time for diet.  She will continue to work on it again and check her glucose regularly.  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  She does not like going to court and worries about the children and their safety.  She became tearful while telling me about her foster child and upcoming court dates for him which she thankfully no longer has to appear.  She hopes it all works out well and that she stresses less. I have informed her about [facility] and that if she feels her anxiety worsening or depression to call or come to clinic or if very severe go to ED.  She has no major complaints at this this time.  She denies N/V/D, fever, chills, headaches, chest pain, sob, abdominal pain, or urinary complaints at that time.   I have discussed with her the need to meet again with Norm Parcel for more diabetic education and assistance with nutrition.    Past Medical History  Diagnosis Date  . Diabetic gastroparesis associated with type 2 diabetes mellitus   . Diabetes mellitus     HgA1c 8.1 in 12/2009  . Hyperlipidemia   . Hypertension   . Allergic rhinitis   . Obstructive sleep apnea     Moderately severe obstructive sleep apnea/hypopnea syndrome per Sleep study 04/2009. Patient declined CPAP use (although recommended) secondary to claustrophobia. This did resolved after  tonsillectomy in 2005.  Marland Kitchen Fatty liver disease, nonalcoholic   . Obesity   . Cholelithiasis     no symptoms   Current Outpatient Prescriptions  Medication Sig Dispense Refill  . amLODipine (NORVASC) 10 MG tablet Take 1 tablet (10 mg total) by mouth daily.  90 tablet  4  . atorvastatin (LIPITOR) 10 MG tablet Take 1 tablet (10 mg total) by mouth daily.  31 tablet  6  . desloratadine (CLARINEX) 5 MG tablet Take 1 tablet (5 mg total) by mouth daily.  30 tablet  2  . hydrochlorothiazide (HYDRODIURIL) 25 MG tablet Take 1 tablet (25 mg total) by mouth daily.  30 tablet  3  . insulin NPH (HUMULIN N PEN) 100 UNIT/ML injection Inject 50 units in the morning and 45 units in the evening.  90 mL  3  . Ipratropium-Albuterol (COMBIVENT RESPIMAT) 20-100 MCG/ACT AERS respimat Inhale 1 puff into the lungs 4 (four) times daily as needed for wheezing or shortness of breath (Do not exceed 6 doses in 24 hours.).  1 Inhaler  6  . meloxicam (MOBIC) 15 MG tablet Take 1 tablet (15 mg total) by mouth daily.  30 tablet  2  . mometasone (NASONEX) 50 MCG/ACT nasal spray Place 1 spray into the nose daily.  17 g  3  . pantoprazole (PROTONIX) 40 MG tablet Take 1 tablet (40 mg total) by mouth daily.  90 tablet  3  . quinapril (ACCUPRIL) 40 MG tablet Take 1 tablet (40 mg total) by mouth at bedtime.  30 tablet  6   Family History  Problem Relation Age of Onset  . Coronary artery disease Mother     MI age 20yo, deceased  . Diabetes Mother   . Heart attack Mother   . Coronary artery disease Father     MI age 88s, deceased  . Heart attack Father   . Diabetes type II    . Hypertension    . Breast cancer     History   Social History  . Marital Status: Divorced    Spouse Name: N/A    Number of Children: N/A  . Years of Education: N/A   Social History Main Topics  . Smoking status: Never Smoker   . Smokeless tobacco: Never Used  . Alcohol Use: No  . Drug Use: No  . Sexually Active: No   Other Topics Concern  .  None   Social History Narrative   Nonsmoker, no drug use, no alcohol use. DivorcedHome maker, caring for 1 foster child   Review of Systems: Constitutional: Denies fever, chills, diaphoresis, appetite change and fatigue.  HEENT: Denies photophobia, eye pain, redness, hearing loss, ear pain, congestion, sore throat, rhinorrhea, sneezing, mouth sores, trouble swallowing, neck pain, neck stiffness and tinnitus.   Respiratory: Denies SOB, DOE, cough, chest tightness,  and wheezing.   Cardiovascular: Denies chest pain, palpitations and leg swelling.  Gastrointestinal: Denies nausea, vomiting, abdominal pain, diarrhea, constipation, blood in stool and abdominal distention.  Genitourinary: Denies dysuria, urgency, frequency, hematuria, flank pain and difficulty urinating.  Musculoskeletal: Denies myalgias, back pain, joint swelling, arthralgias and gait problem.  Skin: Denies pallor, rash and wound.  Neurological: Denies dizziness, seizures, syncope, weakness, light-headedness, numbness and headaches.  Hematological: Denies adenopathy. Easy bruising, personal or family bleeding history  Psychiatric/Behavioral: Tearful and anxious at times. Denies suicidal ideation, mood changes, confusion, nervousness, sleep disturbance and agitation  Objective:  Physical Exam: Filed Vitals:   11/02/11 0932  BP: 141/79  Pulse: 77  Temp: 97.9 F (36.6 C)  TempSrc: Oral  Height: 5\' 9"  (1.753 m)  Weight: 285 lb 9.6 oz (129.547 kg)  SpO2: 99%   Constitutional: Vital signs reviewed.  Patient is a well-developed and well-nourished female tearful but cooperative with exam. Alert and oriented x3.  Head: Normocephalic and atraumatic Ear: TM normal bilaterally Mouth: no erythema or exudates, MMM Eyes: PERRLA, EOMI, conjunctivae normal, No scleral icterus.  Neck: Supple, Trachea midline normal ROM, No JVD, mass, thyromegaly, or carotid bruit present.  Cardiovascular: RRR, S1 normal, S2 normal, no MRG, pulses  symmetric and intact bilaterally Pulmonary/Chest: CTAB, no wheezes, rales, or rhonchi Abdominal: Soft. Obese, Non-tender, non-distended, bowel sounds are normal, no masses, organomegaly, or guarding present.  GU: no CVA tenderness Musculoskeletal: No joint deformities, erythema, or stiffness, ROM full and non-tender, hands slightly trembling--claims secondary to anxiety. -Edema, +2DP b/l Hematology: no cervical, inginal, or axillary adenopathy.  Neurological: A&O x3, Strength is normal and symmetric bilaterally, cranial nerve II-XII are grossly intact, no focal motor deficit, sensory intact to light touch bilaterally.  Skin: Warm, dry and intact. No rash, cyanosis, or clubbing.  Psychiatric: Normal mood and affect. speech and behavior is normal. Judgment and thought content normal. Cognition and memory are normal.   Assessment & Plan:  Discussed with Dr. Kem Kays  Check blood glucose regular and bring values to pcp on f/u

## 2011-11-02 NOTE — Assessment & Plan Note (Signed)
Tearful during visit mainly due to upcoming course case of one of her foster children.  She is very attached to her foster children and stresses out when they have visits with the judge.  Advised her to do the best that she can and reach out to family and friends upset.  Also discussed [facility] with her and provided her contact number if she so chooses.    -continue to monitor

## 2011-11-02 NOTE — Assessment & Plan Note (Addendum)
Poorly controlled diabetes, does not regularly check her blood sugars, did not bring her monitor with her, on quinapril 40mg  and NPH.  Is also working on a diet but has not adhered to it lately due to multiple life factors at this time. Will like to get back in control.  Recommended re-visit with Norm Parcel.  -CBG today 106, HbA1c: 8.3 last month -advised to check blood sugars regularly, bring in values, adhere to medication, diabetic diet -f/u with pcp--may adjust diabetic medication based on previous note, possibly metformin?

## 2011-11-02 NOTE — Patient Instructions (Addendum)
Check your blood sugars and bring in values and meter to next visit with pcp  Go to [facility] if anxiety continues, always reach out to people to talk to, or contact or call clinic if symptoms get worse  Continue working on diet  F/u with pcp

## 2011-11-03 NOTE — Assessment & Plan Note (Signed)
Weight 285 pounds today, claims to not adhering to diet due to multiple recent stressors in life at this time.  Will try to work on weight-loss more. Was noted to be 250 pounds in August 2012.    -continue to monitor and encourage weight loss and diabetic diet -discussed re consultation with donna plyler for nutrition.

## 2011-11-03 NOTE — Assessment & Plan Note (Addendum)
On Norvasc, HCTZ, and Accupril. BP 141/79 today.  -continue to monitor

## 2011-11-07 ENCOUNTER — Other Ambulatory Visit: Payer: Self-pay | Admitting: *Deleted

## 2011-11-07 MED ORDER — QUINAPRIL HCL 40 MG PO TABS: 40.0000 mg | ORAL_TABLET | Freq: Every day | ORAL | Status: DC

## 2011-11-08 NOTE — Telephone Encounter (Signed)
Called to pharm 

## 2011-11-09 ENCOUNTER — Encounter: Payer: Self-pay | Admitting: Family Medicine

## 2011-11-09 ENCOUNTER — Ambulatory Visit (INDEPENDENT_AMBULATORY_CARE_PROVIDER_SITE_OTHER): Payer: Self-pay | Admitting: Family Medicine

## 2011-11-09 ENCOUNTER — Other Ambulatory Visit: Payer: Self-pay | Admitting: *Deleted

## 2011-11-09 VITALS — BP 132/78 | HR 90 | Ht 69.0 in | Wt 285.0 lb

## 2011-11-09 DIAGNOSIS — E785 Hyperlipidemia, unspecified: Secondary | ICD-10-CM

## 2011-11-09 DIAGNOSIS — K219 Gastro-esophageal reflux disease without esophagitis: Secondary | ICD-10-CM

## 2011-11-09 DIAGNOSIS — M67919 Unspecified disorder of synovium and tendon, unspecified shoulder: Secondary | ICD-10-CM

## 2011-11-09 DIAGNOSIS — M7551 Bursitis of right shoulder: Secondary | ICD-10-CM

## 2011-11-09 MED ORDER — ATORVASTATIN CALCIUM 10 MG PO TABS: 10.0000 mg | ORAL_TABLET | Freq: Every day | ORAL | Status: DC

## 2011-11-09 MED ORDER — PANTOPRAZOLE SODIUM 40 MG PO TBEC: 40.0000 mg | DELAYED_RELEASE_TABLET | Freq: Every day | ORAL | Status: DC

## 2011-11-09 NOTE — Progress Notes (Signed)
Regina Stevenson is a 56 y.o. female who presents to [facility] today for right shoulder pain present for a few days. Pain on the lateral aspect of her right shoulder. Worse with overhead activity. Pain at night. Tried some old meloxicam which has helped a little bit. Denies any pain radiating pain. No pain with neck range of motion. No weakness or numbness. No injury.  Consistent with prior episodes of subacromial bursitis successfully treated with  Injection.  She would like a subacromial bursa injection today if possible.   PMH reviewed. Significant for diabetes hypertension and hyperlipidemia History  Substance Use Topics  . Smoking status: Never Smoker   . Smokeless tobacco: Never Used  . Alcohol Use: No   ROS as above otherwise neg   Exam:  BP 132/78  Pulse 90  Ht 5\' 9"  (1.753 m)  Wt 285 lb (129.275 kg)  BMI 42.09 kg/m2  LMP 04/23/1988 Gen: Well NAD MSK: Right shoulder: Normal-appearing mildly tender over a.c. joint. Abduction full as is forward flexion however pain from 120 to 160 in both for flexion and abduction. External and internal range of motion are normal Negative drop arm sign Strength to supraspinatus testing shows 4/5 supraspinatus and external rotation and 5/5 to internal rotation. Positive Hawkins and Neer's test. Negative Yergason's and speeds.  Neck: Nontender over spinal midline. Normal neck range of motion negative Spurling test.  Neuro: Sensation is intact distally bilaterally Strength is intact distally bilaterally  CV: Normal pulses bilateral upper extremities  Musculoskeletal ultrasound of the right shoulder: Biceps tendon visualized on long and short views. Normal-appearing Subscapularis seen on long and short views. Increased hypoechoic changes at the distal tendon. No increased Doppler activity.  No retraction.  Long and short views Supraspinatus:  Hypoechoic changes seen at the distal tendon. No increased Doppler activity. Long and short views Impingement  view of the supraspinatus tendon shows significant subacromial bursa impingement and bunching.  No gapping of the supraspinatus tendon. Infraspinatus: Normal appearing on long and short views A.c. Joint: Narrow a.c. joint with increased fluid within the joint capsule.  Procedure note: Right shoulder subacromial injection  Consent obtained and time out performed.  Area cleaned with alcohol.  40Mg  of depomedrol and 4ml of 0.5% marcaine was injected into the subacromial bursa without complication or bleeding. Patient tolerated the procedure well.    Following the injection range of motion was pain free and impingement testing were negative.

## 2011-11-09 NOTE — Assessment & Plan Note (Signed)
Right shoulder subacromial bursitis. Seen on musculoskeletal ultrasound. No frank rotator cuff full thickness tears seen.   Plan: Subacromial injection.  JOBE exercises Followup in a few weeks if not improving.  Discussed warning signs or symptoms. Please see discharge instructions. Patient expresses understanding.

## 2011-11-09 NOTE — Patient Instructions (Addendum)
Thank you for coming in today. Please do the shoulder exercises.  Tylenol or ibuprofen for pain as needed.  Call or go to the ER if you develop a large red swollen joint with extreme pain or oozing puss.  Come back as needed

## 2011-11-09 NOTE — Telephone Encounter (Signed)
Called to pharm 

## 2011-11-12 NOTE — Progress Notes (Signed)
[facility] Attending Note:  I have discussed this patient with the [facility] Fellowand reviewed the assessment and plan as documented above. I agree with the Fellow's  findings and plan.

## 2011-11-16 ENCOUNTER — Other Ambulatory Visit: Payer: Self-pay | Admitting: *Deleted

## 2011-11-16 MED ORDER — DESLORATADINE 5 MG PO TABS: 5.0000 mg | ORAL_TABLET | Freq: Every day | ORAL | Status: DC

## 2011-11-16 NOTE — Telephone Encounter (Signed)
Called to pharm 

## 2012-02-15 ENCOUNTER — Ambulatory Visit (INDEPENDENT_AMBULATORY_CARE_PROVIDER_SITE_OTHER): Payer: No Typology Code available for payment source | Admitting: Family Medicine

## 2012-02-15 VITALS — BP 159/84 | Ht 66.0 in | Wt 280.0 lb

## 2012-02-15 DIAGNOSIS — M62838 Other muscle spasm: Secondary | ICD-10-CM

## 2012-02-15 DIAGNOSIS — M719 Bursopathy, unspecified: Secondary | ICD-10-CM

## 2012-02-15 DIAGNOSIS — M7551 Bursitis of right shoulder: Secondary | ICD-10-CM

## 2012-02-15 DIAGNOSIS — M25559 Pain in unspecified hip: Secondary | ICD-10-CM

## 2012-02-15 DIAGNOSIS — M76899 Other specified enthesopathies of unspecified lower limb, excluding foot: Secondary | ICD-10-CM

## 2012-02-15 DIAGNOSIS — M7061 Trochanteric bursitis, right hip: Secondary | ICD-10-CM

## 2012-02-15 DIAGNOSIS — M67919 Unspecified disorder of synovium and tendon, unspecified shoulder: Secondary | ICD-10-CM

## 2012-02-15 DIAGNOSIS — M25551 Pain in right hip: Secondary | ICD-10-CM

## 2012-02-15 MED ORDER — TRAMADOL HCL 50 MG PO TABS: 50.0000 mg | ORAL_TABLET | Freq: Four times a day (QID) | ORAL | Status: AC | PRN

## 2012-02-15 MED ORDER — CYCLOBENZAPRINE HCL 10 MG PO TABS: 10.0000 mg | ORAL_TABLET | Freq: Every evening | ORAL | Status: DC | PRN

## 2012-02-15 NOTE — Patient Instructions (Signed)
Thank you for coming in today. 1) Right shoulder: We will re-try the injection today.  We will also do physical therapy for the shoulder.  2) Left shoulder: I think this is coming from your neck. Use the muscle relaxer at night, heat and PT.  We will get xrays as well.  3) Right hip: This is greater trochanteric bursitis and will benefit from a shot and PT.  Come back in 6 weeks or so for a recheck.

## 2012-02-15 NOTE — Assessment & Plan Note (Signed)
Patient with greater trochanteric bursitis of the right hip Plan: Corticosteroid injection today Physical therapy from exercise program Followup in 4-6 weeks

## 2012-02-15 NOTE — Assessment & Plan Note (Signed)
Right shoulder subacromial bursitis. Seen on musculoskeletal ultrasound. No frank rotator cuff full thickness tears seen.   Plan: Subacromial injection.  Physical therapy for home exercise program.  Three-view shoulder x-ray to evaluate for glenohumeral DJD Followup in 4-6 weeks.

## 2012-02-15 NOTE — Progress Notes (Signed)
Regina Stevenson is a 57 y.o. female who presents to [facility] today for  1) right shoulder pain: Was seen back in October for the same where she was diagnosed with right shoulder subacromial bursitis and rotator cuff tendinitis. She had a corticosteroid injection which did not help at all.  She notes continued right shoulder pain especially at night when lying on that side and with overhand activity. She denies significant radiating pain weakness or numbness to her right arm. She takes ibuprofen which is moderately helpful. She rates her pain as moderate.   2) left neck pain: Patient notes pain in the left lateral aspect of her cervical spine radiating to the left trapezius. This is present for several weeks now worse with activity better with rest the pain is moderate. Ibuprofen is only mildly helpful. She denies any significant pain radiating to her left arm weakness or numbness. She denies any injury.  3) right lateral hip: Patient has pain in the lateral hip in the greater trochanter region.  This is present for several weeks. She denies any injury. She notes pain is worse when she lays on her right side and when rising from a seated position.  She's tried ibuprofen which helps a bit. She rates her pain as moderate. She denies any significant neck pain or pain or weakness radiating to her leg.    PMH reviewed. Morbid obesity, hypertension, diabetes History  Substance Use Topics  . Smoking status: Never Smoker   . Smokeless tobacco: Never Used  . Alcohol Use: No   ROS as above otherwise neg   Exam:  BP 159/84  Ht 5\' 6"  (1.676 m)  Wt 280 lb (127.007 kg)  BMI 45.19 kg/m2  LMP 04/23/1988 Gen: Well NAD MSK: Neck:  Nontender over spinal midline. Excessive cervical lordosis. Range of motion normal to flexion rightward rotation and right lateral flexion limited to extension left rotation and left lateral flexion secondary to pain.  Tender over left lateral cervical paraspinal muscles, left trapezius.    Spasm palpated and left trapezius.  Normal grip strength bilateral upper tremor is normal sensation and capillary refill.   Right shoulder: Normal-appearing no significant tenderness Range of motion limited to abduction about 110 external rotation to 70 and internal rotation to the buttocks.  Normal strength but significant pain with supraspinatus testing normal strength with external and internal rotation.  Positive Hawkins and Neers tests.   Right hip: Tender to palpation over the right greater trochanter Nontender hip rotation  Gait: Antalgic  Procedure:   Right subacromial injection Consent obtained. Landmarks palpated and marked with indention.  Area cleaned with alcohol and cold spray applied. 4ml of Marcaine and 40mg  Depo-Medrol placed into the subacromial bursa with a 23 gauge 1.5 inch needle. No bleeding. Patient tolerated the procedure well.   Right greater trochanteric injection:  Consent obtained. Landmarks palpated and marked with indention. Area cleaned with alcohol. Cold spray applied. Using a 3 inch 21-gauge spinal needle the greater trochanteric bursa was accessed. The tip of the needle was bounced off of the bone in a wheel pattern while 4 mL of 0.5% Marcaine and 40 mg of Depo-Medrol were injected into the greater trochanteric bursa area. Patient tolerated procedure well with no pain or bleeding.

## 2012-02-15 NOTE — Assessment & Plan Note (Signed)
Cervical spasm likely secondary to DDD.  Plan to treat with tramadol, and Flexeril along with heat and physical therapy.  We'll obtain a two-view C-spine to evaluate for DDD Followup in 4-6 weeks

## 2012-02-25 ENCOUNTER — Ambulatory Visit (HOSPITAL_COMMUNITY)
Admission: RE | Admit: 2012-02-25 | Discharge: 2012-02-25 | Disposition: A | Discharge status: Home / Self Care | Payer: No Typology Code available for payment source | Source: Ambulatory Visit | Attending: Family Medicine | Admitting: Family Medicine

## 2012-02-25 DIAGNOSIS — M62838 Other muscle spasm: Secondary | ICD-10-CM

## 2012-02-25 DIAGNOSIS — M7551 Bursitis of right shoulder: Secondary | ICD-10-CM

## 2012-02-25 DIAGNOSIS — M67919 Unspecified disorder of synovium and tendon, unspecified shoulder: Secondary | ICD-10-CM | POA: Insufficient documentation

## 2012-02-25 DIAGNOSIS — M719 Bursopathy, unspecified: Secondary | ICD-10-CM | POA: Insufficient documentation

## 2012-03-03 ENCOUNTER — Ambulatory Visit: Payer: No Typology Code available for payment source | Attending: Family Medicine | Admitting: Physical Therapy

## 2012-03-03 DIAGNOSIS — IMO0001 Reserved for inherently not codable concepts without codable children: Secondary | ICD-10-CM | POA: Insufficient documentation

## 2012-03-03 DIAGNOSIS — M25519 Pain in unspecified shoulder: Secondary | ICD-10-CM | POA: Insufficient documentation

## 2012-03-03 DIAGNOSIS — R293 Abnormal posture: Secondary | ICD-10-CM | POA: Insufficient documentation

## 2012-03-07 ENCOUNTER — Ambulatory Visit (INDEPENDENT_AMBULATORY_CARE_PROVIDER_SITE_OTHER): Payer: No Typology Code available for payment source | Admitting: Family Medicine

## 2012-03-07 VITALS — BP 151/88 | Ht 66.0 in | Wt 280.0 lb

## 2012-03-07 DIAGNOSIS — M25551 Pain in right hip: Secondary | ICD-10-CM

## 2012-03-07 DIAGNOSIS — M25559 Pain in unspecified hip: Secondary | ICD-10-CM

## 2012-03-07 DIAGNOSIS — M67919 Unspecified disorder of synovium and tendon, unspecified shoulder: Secondary | ICD-10-CM

## 2012-03-07 DIAGNOSIS — M7551 Bursitis of right shoulder: Secondary | ICD-10-CM

## 2012-03-07 MED ORDER — GABAPENTIN 100 MG PO CAPS: ORAL_CAPSULE | ORAL | Status: DC

## 2012-03-07 NOTE — Progress Notes (Signed)
  Subjective:    Patient ID: Regina Stevenson, female    DOB: [birth year], 57 y.o.   MRN: [id number]  HPI  Right hip pain the last 4-6 weeks. Over the last 2 weeks has been particularly aggravating. Hurts to walk up steps. Standing for long time is painful but so is sitting for long period of time. No giving way.. We had given her a greater trochanteric injection which really seem to help a lot for pain. She's experiencing now is different from that calf pain, being located more in the groin area been on the lateral portion of the hip. #2. Continued intermittent right shoulder pain. Unchanged from previous visit. Aching.  Review of Systems Denies fever, sweats, chills. No numbness or tingling in right upper extremity or right lower stream any.    Objective:   Physical Exam Vital signs reviewed Is GENERAL: Well-developed overweight female no acute distress HIPS: Right. Slight pain with internal rotation before range of motion internal and external rotation of both hips. Normal flexion and extension. Muscle bulk and tone bilateral quads and hamstrings is normal. Distally neurovascularly intact. Greater trochanteric bursa nontender to palpation bilaterally. SHOULDER: Full range of motion in all planes the rotator cuff and strength is intact although she has some pain with supraspinatus testing. Tender to palpation over the right a.c. joint. IMAGING: Reviewed her neck films which show some degenerative changes at C5-C6. She also has some osteophytes. SHOULDER imaging: A.c. joint arthropathy. Shoulder is located. The humeral head is without any sign of defect. There is not a lot of glenohumeral joint arthropathy.       Assessment & Plan:  #1. Hip pain on the right. She actually has some pain on the left as well but much milder. I suspect she has some DJD. We will get x-rays. Her main issue is with sleep at night so we will start her on gabapentin 100 mg and can taper up to 300 mg each bedtime. I will  see her back in 3-4 weeks and we'll review her films at that time. #2. A.c. joint arthropathy which I think is contributing to her shoulder pain. We discussed options for that today such as a ultrasound guided injection. She like to think about that and see if the gabapentin helps the shoulder.

## 2012-03-11 ENCOUNTER — Ambulatory Visit: Payer: No Typology Code available for payment source | Attending: Internal Medicine | Admitting: Physical Therapy

## 2012-03-11 DIAGNOSIS — M25519 Pain in unspecified shoulder: Secondary | ICD-10-CM | POA: Insufficient documentation

## 2012-03-11 DIAGNOSIS — R293 Abnormal posture: Secondary | ICD-10-CM | POA: Insufficient documentation

## 2012-03-11 DIAGNOSIS — IMO0001 Reserved for inherently not codable concepts without codable children: Secondary | ICD-10-CM | POA: Insufficient documentation

## 2012-03-15 ENCOUNTER — Ambulatory Visit (HOSPITAL_COMMUNITY)
Admission: RE | Admit: 2012-03-15 | Discharge: 2012-03-15 | Disposition: A | Discharge status: Home / Self Care | Payer: No Typology Code available for payment source | Source: Ambulatory Visit | Attending: Family Medicine | Admitting: Family Medicine

## 2012-03-15 DIAGNOSIS — M545 Low back pain, unspecified: Secondary | ICD-10-CM | POA: Insufficient documentation

## 2012-03-15 DIAGNOSIS — M25552 Pain in left hip: Secondary | ICD-10-CM

## 2012-03-15 DIAGNOSIS — M25559 Pain in unspecified hip: Secondary | ICD-10-CM | POA: Insufficient documentation

## 2012-03-18 ENCOUNTER — Ambulatory Visit: Payer: No Typology Code available for payment source | Admitting: Physical Therapy

## 2012-03-26 ENCOUNTER — Encounter: Payer: No Typology Code available for payment source | Admitting: Physical Therapy

## 2012-04-04 ENCOUNTER — Encounter: Payer: Self-pay | Admitting: Family Medicine

## 2012-04-04 ENCOUNTER — Ambulatory Visit (INDEPENDENT_AMBULATORY_CARE_PROVIDER_SITE_OTHER): Payer: No Typology Code available for payment source | Admitting: Family Medicine

## 2012-04-04 VITALS — BP 147/83 | HR 108 | Ht 66.0 in | Wt 280.0 lb

## 2012-04-04 DIAGNOSIS — E669 Obesity, unspecified: Secondary | ICD-10-CM

## 2012-04-04 MED ORDER — GABAPENTIN 100 MG PO CAPS: ORAL_CAPSULE | ORAL | Status: DC

## 2012-04-04 NOTE — Patient Instructions (Addendum)
Start taking the gabapentin with three tabs at night and one in the morning. After 4-5 days increase to TWO in the morning and then after 4-5 days again increase to THREE in the morning. You are still taking three at night so your final dose is 3 tabs in AM and 3 tabs in PM Let me see you back in 3-4 weeks. Great to see you!

## 2012-04-04 NOTE — Progress Notes (Signed)
Unable to get gabapentin $$$$

## 2012-04-07 DIAGNOSIS — M169 Osteoarthritis of hip, unspecified: Secondary | ICD-10-CM | POA: Insufficient documentation

## 2012-04-07 NOTE — Progress Notes (Signed)
  Subjective:    Patient ID: Regina Stevenson, female    DOB: 04-19-1955, 57 y.o.   MRN: [id number]  HPI  Followup bilateral but right greater than left hip pain. She had her x-rays. We had started her on gabapentin and that has actually helped her a fair amount. They continues to be mostly troublesome in the evenings and at night. Right hip is greater discomfort in the left 4-6/10. No radiation into the lower part of the leg or the foot. No leg weakness.  Review of Systems No unusual weight gain or loss. No fever, sweats, chills.    Objective:   Physical Exam  Vital signs are reviewed GENERAL: Well-developed overweight female no acute distress HIP: Right. Internal/external rotation is full and painless. She is mildly tender to palpation over the greater trochanteric bursa area today. Lower extremity strength at hip flexion hip extension is 5 out of 5 symmetrical. IMAGING: Moderate DJD bilateral hips. Question of some sacroiliac joint issues by x-ray per radiology report.      Assessment & Plan:  #1. DJD bilateral hips right more painful than the left. Weight loss was discussed. Given the fact that she has some mild tenderness to palpation today her greater trochanteric bursae offered her corticosteroid injection there. She declined. We will's try to increase her gabapentin and she has had some relief from that. Note: Unable to forward gabapentin at the higher doses. Will try her on soma as she has allergies to nortriptyline so amitriptyline is not an option. We are also not option secondary to expense.

## 2012-04-08 ENCOUNTER — Other Ambulatory Visit: Payer: Self-pay | Admitting: *Deleted

## 2012-04-08 MED ORDER — MOMETASONE FUROATE 50 MCG/ACT NA SUSP: 1.0000 | Freq: Every day | NASAL | Status: DC

## 2012-04-08 NOTE — Telephone Encounter (Signed)
Rx called in to pharmacy. 

## 2012-04-22 ENCOUNTER — Ambulatory Visit: Payer: No Typology Code available for payment source | Admitting: Internal Medicine

## 2012-04-29 ENCOUNTER — Ambulatory Visit: Payer: No Typology Code available for payment source | Admitting: Internal Medicine

## 2012-05-02 ENCOUNTER — Ambulatory Visit: Payer: No Typology Code available for payment source | Admitting: Family Medicine

## 2012-05-12 ENCOUNTER — Other Ambulatory Visit: Payer: Self-pay | Admitting: *Deleted

## 2012-05-12 ENCOUNTER — Other Ambulatory Visit: Payer: Self-pay | Admitting: Internal Medicine

## 2012-05-12 DIAGNOSIS — I10 Essential (primary) hypertension: Secondary | ICD-10-CM

## 2012-05-12 MED ORDER — HYDROCHLOROTHIAZIDE 25 MG PO TABS: 25.0000 mg | ORAL_TABLET | Freq: Every day | ORAL | Status: DC

## 2012-05-13 NOTE — Telephone Encounter (Signed)
Called to pharm 

## 2012-06-06 ENCOUNTER — Other Ambulatory Visit: Payer: Self-pay | Admitting: *Deleted

## 2012-06-06 MED ORDER — IPRATROPIUM-ALBUTEROL 20-100 MCG/ACT IN AERS: 1.0000 | INHALATION_SPRAY | Freq: Four times a day (QID) | RESPIRATORY_TRACT | Status: DC | PRN

## 2012-06-09 NOTE — Telephone Encounter (Signed)
Called to pharm 

## 2012-06-19 ENCOUNTER — Ambulatory Visit: Payer: No Typology Code available for payment source

## 2012-07-15 ENCOUNTER — Encounter: Payer: Self-pay | Admitting: Dietician

## 2012-07-16 ENCOUNTER — Other Ambulatory Visit: Payer: Self-pay | Admitting: *Deleted

## 2012-07-16 NOTE — Telephone Encounter (Deleted)
Requested Prescriptions   Pending Prescriptions Disp Refills  . gabapentin (NEURONTIN) 100 MG capsule 180 capsule 3    Sig: Patient has written taper up to 3 tabs am and 3 tabs pm for total dose of 600 mg a day.   Wyatt Haste, RN-BSN

## 2012-07-31 ENCOUNTER — Other Ambulatory Visit: Payer: Self-pay | Admitting: *Deleted

## 2012-08-01 ENCOUNTER — Other Ambulatory Visit: Payer: Self-pay | Admitting: Internal Medicine

## 2012-08-01 ENCOUNTER — Other Ambulatory Visit: Payer: Self-pay | Admitting: *Deleted

## 2012-08-01 DIAGNOSIS — Z1231 Encounter for screening mammogram for malignant neoplasm of breast: Secondary | ICD-10-CM

## 2012-08-01 NOTE — Telephone Encounter (Signed)
Pt is using [facility], needs new script, but will need 30 days of vials and thereafter will need pens as she prefers pens, 30 days vials = , 1 month pens = 

## 2012-08-05 ENCOUNTER — Ambulatory Visit (HOSPITAL_COMMUNITY)
Admission: RE | Admit: 2012-08-05 | Discharge: 2012-08-05 | Disposition: A | Discharge status: Home / Self Care | Payer: No Typology Code available for payment source | Source: Ambulatory Visit | Attending: Internal Medicine | Admitting: Internal Medicine

## 2012-08-05 DIAGNOSIS — Z1231 Encounter for screening mammogram for malignant neoplasm of breast: Secondary | ICD-10-CM | POA: Insufficient documentation

## 2012-08-05 MED ORDER — INSULIN NPH (HUMAN) (ISOPHANE) 100 UNIT/ML ~~LOC~~ SUSP: SUBCUTANEOUS | Status: DC

## 2012-08-05 NOTE — Telephone Encounter (Signed)
Will give 30 days of the vial but she needs an appointment so will decline pen script and she has 30 days to get into the clinic.  Genella Mech 08/05/2012 7:13 AM

## 2012-08-11 NOTE — Telephone Encounter (Signed)
Rx called in to pharmacy - pt aware. Stanton Kidney Averianna Brugger RN 08/11/12 11:30AM

## 2012-08-12 ENCOUNTER — Ambulatory Visit (HOSPITAL_COMMUNITY): Payer: No Typology Code available for payment source

## 2012-08-14 ENCOUNTER — Other Ambulatory Visit: Payer: Self-pay

## 2012-08-26 ENCOUNTER — Other Ambulatory Visit: Payer: Self-pay | Admitting: *Deleted

## 2012-08-26 DIAGNOSIS — E785 Hyperlipidemia, unspecified: Secondary | ICD-10-CM

## 2012-08-26 NOTE — Telephone Encounter (Signed)
[facility] can no longer get Lipitor, would you consider changing to Crestor 5 mg?  Will need new Rx.

## 2012-08-27 ENCOUNTER — Other Ambulatory Visit: Payer: Self-pay | Admitting: *Deleted

## 2012-08-27 MED ORDER — AMLODIPINE BESYLATE 10 MG PO TABS: 10.0000 mg | ORAL_TABLET | Freq: Every day | ORAL | Status: DC

## 2012-08-27 MED ORDER — ROSUVASTATIN CALCIUM 10 MG PO TABS: 10.0000 mg | ORAL_TABLET | Freq: Every day | ORAL | Status: DC

## 2012-08-27 NOTE — Telephone Encounter (Signed)
Ok done with crestor.  Thanks, Dr. Dorise Hiss

## 2012-08-27 NOTE — Telephone Encounter (Signed)
Rx faxed in.

## 2012-09-05 ENCOUNTER — Ambulatory Visit (INDEPENDENT_AMBULATORY_CARE_PROVIDER_SITE_OTHER): Payer: No Typology Code available for payment source | Admitting: Internal Medicine

## 2012-09-05 ENCOUNTER — Encounter: Payer: Self-pay | Admitting: Internal Medicine

## 2012-09-05 VITALS — BP 136/89 | HR 101 | Temp 97.8°F | Ht 65.5 in | Wt 279.6 lb

## 2012-09-05 DIAGNOSIS — I10 Essential (primary) hypertension: Secondary | ICD-10-CM

## 2012-09-05 DIAGNOSIS — J45909 Unspecified asthma, uncomplicated: Secondary | ICD-10-CM

## 2012-09-05 DIAGNOSIS — E785 Hyperlipidemia, unspecified: Secondary | ICD-10-CM

## 2012-09-05 DIAGNOSIS — J4520 Mild intermittent asthma, uncomplicated: Secondary | ICD-10-CM

## 2012-09-05 DIAGNOSIS — E1149 Type 2 diabetes mellitus with other diabetic neurological complication: Secondary | ICD-10-CM

## 2012-09-05 LAB — BASIC METABOLIC PANEL WITH GFR
CO2: 25 mEq/L (ref 19–32)
Chloride: 101 mEq/L (ref 96–112)
Creat: 1.36 mg/dL — ABNORMAL HIGH (ref 0.50–1.10)
GFR, Est Non African American: 43 mL/min — ABNORMAL LOW
Potassium: 4.8 mEq/L (ref 3.5–5.3)
Sodium: 137 mEq/L (ref 135–145)

## 2012-09-05 LAB — LIPID PANEL
Cholesterol: 182 mg/dL (ref 0–200)
HDL: 47 mg/dL (ref >39)

## 2012-09-05 LAB — POCT URINALYSIS DIPSTICK
Bilirubin, UA: NEGATIVE
Glucose, UA: NEGATIVE
Leukocytes, UA: NEGATIVE

## 2012-09-05 LAB — GLUCOSE, CAPILLARY: Glucose-Capillary: 190 mg/dL — ABNORMAL HIGH (ref 70–99)

## 2012-09-05 LAB — POCT GLYCOSYLATED HEMOGLOBIN (HGB A1C): Hemoglobin A1C: 9.8

## 2012-09-05 MED ORDER — METFORMIN HCL 500 MG PO TABS: 250.0000 mg | ORAL_TABLET | Freq: Two times a day (BID) | ORAL | Status: DC

## 2012-09-05 NOTE — Patient Instructions (Signed)
General Instructions:  We will add a medicine called metformin for your diabetes. Take 1/2 pill with breakfast for the first week. Then take 1/2 pill with breakfast and dinner. If you have problems call us.  Come back in 2 months to check on your sugars. Call sooner with questions or problems at [phone].  Treatment Goals:  Goals (1 Years of Data) as of 09/05/12   None      Progress Toward Treatment Goals:  Treatment Goal 09/05/2012  Hemoglobin A1C deteriorated  Blood pressure at goal    Self Care Goals & Plans:  Self Care Goal 09/05/2012  Manage my medications take my medicines as prescribed; bring my medications to every visit; refill my medications on time; follow the sick day instructions if I am sick  Monitor my health keep track of my blood glucose; bring my glucose meter and log to each visit; check my feet daily  Eat healthy foods eat more vegetables; eat fruit for snacks and desserts; eat foods that are low in salt; eat baked foods instead of fried foods; eat smaller portions; drink diet soda or water instead of juice or soda  Be physically active take a walk every day; find an activity I enjoy    Home Blood Glucose Monitoring 09/05/2012  Check my blood sugar 3 times a day  When to check my blood sugar before meals     Care Management & Community Referrals:  Referral 09/05/2012  Referrals made for care management support none needed

## 2012-09-06 LAB — MICROALBUMIN / CREATININE URINE RATIO
Creatinine, Urine: 268.9 mg/dL
Microalb Creat Ratio: 21.5 mg/g (ref 0.0–30.0)
Microalb, Ur: 5.79 mg/dL — ABNORMAL HIGH (ref 0.00–1.89)

## 2012-09-06 NOTE — Progress Notes (Signed)
Subjective:     Patient ID: Regina Stevenson, female   DOB: Dec 19, 1955, 57 y.o.   MRN: [id number]  HPI  The patient is a 57 YO female who comes in today for a routine visit with her PCP. She has not been seen in some time and denies any acute problems. She hit her leg getting out of the tub this morning and has a small knot on her leg. There is no bruising and she was able to bear weight immediately after this episode. She does not note that she has changed her eating recently but she does not think that she eats well. She has not had her eyes checked in some time. She has used her inhaler several times in the last day with allergies (i.e. she was around dust recently) but does not use it at night time or more often that once per week otherwise. She has not noticed any fevers, rashes, SOB, cough or cold symptoms. She continues to take her insulin 55 units at night and 50 units in the morning. She states that she is still taking her blood pressure medicines. She states she had a yeast infection which she treated with over the counter medication and is resolving but she is still having some pain and she wanted to get her urine checked.   Review of Systems  Constitutional: Negative for fever, chills, diaphoresis, activity change, appetite change, fatigue and unexpected weight change.  HENT: Negative.  Negative for hearing loss, ear pain, nosebleeds, congestion, sore throat, facial swelling, rhinorrhea, sneezing, drooling, mouth sores, trouble swallowing, neck pain, neck stiffness, dental problem, voice change, postnasal drip, sinus pressure, tinnitus and ear discharge.   Eyes: Negative.   Respiratory: Negative for cough, choking, chest tightness, shortness of breath, wheezing and stridor.   Cardiovascular: Negative for chest pain, palpitations and leg swelling.  Gastrointestinal: Negative.  Negative for nausea, vomiting, abdominal pain, diarrhea, constipation and abdominal distention.  Musculoskeletal:  Negative for myalgias, back pain, joint swelling and gait problem.  Skin: Negative.   Neurological: Negative for dizziness, tremors, seizures, syncope, facial asymmetry, speech difficulty, weakness, light-headedness, numbness and headaches.  Psychiatric/Behavioral: Negative.        Objective:   Physical Exam  Constitutional: She is oriented to person, place, and time. She appears well-developed and well-nourished. No distress.  HENT:  Head: Normocephalic and atraumatic.  Eyes: EOM are normal. Pupils are equal, round, and reactive to light.  Neck: Normal range of motion. Neck supple.  Cardiovascular: Normal rate and regular rhythm.   No murmur heard. Pulmonary/Chest: Effort normal. No respiratory distress. She has no wheezes. She has no rales. She exhibits no tenderness.  Abdominal: Soft. Bowel sounds are normal. She exhibits no distension. There is no tenderness. There is no rebound.  Musculoskeletal: Normal range of motion. She exhibits no edema.  Neurological: She is alert and oriented to person, place, and time. No cranial nerve deficit.  Skin: Skin is warm and dry. She is not diaphoretic.  Psychiatric: She has a normal mood and affect. Her behavior is normal. Judgment and thought content normal.       Assessment/Plan:   1. Please see problem oriented charting.   2. Disposition - The patient will be seen back in 2 months for close follow up. She was started on metformin 250 daily for 1 week then 250 mg bid if able to tolerate. HgA1c worse at today's visit. BMP and lipid panel drawn. We checked POCT urinalysis which was not remarkable for signs of  infection or protein. We also sent off a microalbumin to creatinine ratio. We tried to do the retinal camera with this patient however the image was unable to be obtained and therefore she was asked to return for additional try with the camera.

## 2012-09-06 NOTE — Assessment & Plan Note (Signed)
Lab Results  Component Value Date   HGBA1C 9.8 09/05/2012   HGBA1C 8.3 10/02/2011   HGBA1C 7.5 06/26/2011     Assessment: Diabetes control: poor control (HgbA1C >9%) Progress toward A1C goal:  deteriorated Comments: increase in HgA1c likely secondary to poor follow up  Plan: Medications:  continue current medications, insulin NPH 55 units at bedtime and 50 units in the morning, add metformin 250 mg daily for 1 week then increase to 250 mg BID with intention of further upward titration at next visit if tolerating Home glucose monitoring: Frequency: 3 times a day Timing: before meals Instruction/counseling given: reminded to get eye exam and reminded to bring medications to each visit Educational resources provided: brochure;handout Self management tools provided: instructions for home glucose monitoring Other plans: tried to get retinal camera picture and was unsuccessful.

## 2012-09-06 NOTE — Assessment & Plan Note (Signed)
Patient uses combivent as needed and not usually more than once per week. No recent exacerbations. Good allergy control seems to help her as well. She is a non-smoker.

## 2012-09-06 NOTE — Assessment & Plan Note (Signed)
BP Readings from Last 3 Encounters:  09/05/12 136/89  04/04/12 147/83  03/07/12 151/88    Lab Results  Component Value Date   NA 137 09/05/2012   K 4.8 09/05/2012   CREATININE 1.36* 09/05/2012    Assessment: Blood pressure control: controlled Progress toward BP goal:  at goal Comments: check BMP at today's visit  Plan: Medications:  continue current medications, quinapril 40 mg daily, norvasc 10 mg daily, HCTZ 25 mg daily. Educational resources provided: Comptroller tools provided:   Other plans:

## 2012-09-06 NOTE — Assessment & Plan Note (Signed)
Check lipid panel today and still on low dose lipitor with no problems and can titrate up if necessary.

## 2012-09-08 NOTE — Progress Notes (Signed)
Case discussed with Dr. Kollar soon after the resident saw the patient.  We reviewed the resident's history and exam and pertinent patient test results.  I agree with the assessment, diagnosis, and plan of care documented in the resident's note. 

## 2012-09-11 ENCOUNTER — Other Ambulatory Visit: Payer: Self-pay | Admitting: *Deleted

## 2012-09-11 DIAGNOSIS — E1149 Type 2 diabetes mellitus with other diabetic neurological complication: Secondary | ICD-10-CM

## 2012-09-11 MED ORDER — INSULIN NPH (HUMAN) (ISOPHANE) 100 UNIT/ML ~~LOC~~ SUSP: SUBCUTANEOUS | Status: DC

## 2012-09-15 ENCOUNTER — Other Ambulatory Visit: Payer: Self-pay | Admitting: *Deleted

## 2012-09-15 MED ORDER — IPRATROPIUM-ALBUTEROL 20-100 MCG/ACT IN AERS: 1.0000 | INHALATION_SPRAY | Freq: Four times a day (QID) | RESPIRATORY_TRACT | Status: DC | PRN

## 2012-09-15 NOTE — Telephone Encounter (Signed)
Refill approved - nurse to complete. 

## 2012-09-15 NOTE — Telephone Encounter (Signed)
Called to pharm 

## 2012-09-16 NOTE — Telephone Encounter (Signed)
Rx faxed in.

## 2012-11-05 ENCOUNTER — Other Ambulatory Visit: Payer: Self-pay | Admitting: Internal Medicine

## 2012-11-06 ENCOUNTER — Other Ambulatory Visit: Payer: Self-pay | Admitting: Internal Medicine

## 2012-11-14 ENCOUNTER — Other Ambulatory Visit: Payer: Self-pay | Admitting: *Deleted

## 2012-11-14 DIAGNOSIS — K219 Gastro-esophageal reflux disease without esophagitis: Secondary | ICD-10-CM

## 2012-11-14 MED ORDER — PANTOPRAZOLE SODIUM 40 MG PO TBEC: 40.0000 mg | DELAYED_RELEASE_TABLET | Freq: Every day | ORAL | Status: AC

## 2012-11-14 MED ORDER — DESLORATADINE 5 MG PO TABS: 5.0000 mg | ORAL_TABLET | Freq: Every day | ORAL | Status: AC

## 2012-11-14 MED ORDER — QUINAPRIL HCL 40 MG PO TABS: 40.0000 mg | ORAL_TABLET | Freq: Every day | ORAL | Status: DC

## 2012-12-12 ENCOUNTER — Encounter: Payer: Self-pay | Admitting: Internal Medicine

## 2012-12-12 ENCOUNTER — Ambulatory Visit (INDEPENDENT_AMBULATORY_CARE_PROVIDER_SITE_OTHER): Payer: No Typology Code available for payment source | Admitting: Internal Medicine

## 2012-12-12 VITALS — BP 135/90 | HR 88 | Temp 97.1°F | Ht 66.0 in | Wt 284.3 lb

## 2012-12-12 DIAGNOSIS — E1149 Type 2 diabetes mellitus with other diabetic neurological complication: Secondary | ICD-10-CM

## 2012-12-12 DIAGNOSIS — Z23 Encounter for immunization: Secondary | ICD-10-CM

## 2012-12-12 DIAGNOSIS — I10 Essential (primary) hypertension: Secondary | ICD-10-CM

## 2012-12-12 DIAGNOSIS — E119 Type 2 diabetes mellitus without complications: Secondary | ICD-10-CM

## 2012-12-12 DIAGNOSIS — J309 Allergic rhinitis, unspecified: Secondary | ICD-10-CM

## 2012-12-12 LAB — BASIC METABOLIC PANEL WITH GFR
BUN: 16 mg/dL (ref 6–23)
Chloride: 101 mEq/L (ref 96–112)
Creat: 1.3 mg/dL — ABNORMAL HIGH (ref 0.50–1.10)
GFR, Est Non African American: 45 mL/min — ABNORMAL LOW
Glucose, Bld: 133 mg/dL — ABNORMAL HIGH (ref 70–99)
Potassium: 4.5 mEq/L (ref 3.5–5.3)

## 2012-12-12 LAB — GLUCOSE, CAPILLARY: Glucose-Capillary: 141 mg/dL — ABNORMAL HIGH (ref 70–99)

## 2012-12-12 MED ORDER — MOMETASONE FUROATE 50 MCG/ACT NA SUSP: 1.0000 | Freq: Every day | NASAL | Status: DC

## 2012-12-12 MED ORDER — IPRATROPIUM-ALBUTEROL 20-100 MCG/ACT IN AERS: 1.0000 | INHALATION_SPRAY | Freq: Four times a day (QID) | RESPIRATORY_TRACT | Status: DC | PRN

## 2012-12-12 MED ORDER — METFORMIN HCL 500 MG PO TABS: 500.0000 mg | ORAL_TABLET | Freq: Two times a day (BID) | ORAL | Status: DC

## 2012-12-12 MED ORDER — HYDROCHLOROTHIAZIDE 25 MG PO TABS: 25.0000 mg | ORAL_TABLET | Freq: Every day | ORAL | Status: DC

## 2012-12-12 MED ORDER — INSULIN NPH (HUMAN) (ISOPHANE) 100 UNIT/ML ~~LOC~~ SUSP: SUBCUTANEOUS | Status: DC

## 2012-12-12 NOTE — Progress Notes (Signed)
Subjective:     Patient ID: Regina Stevenson, female   DOB: 1955/06/22, 57 y.o.   MRN: [id number]  HPI The patient is a 57 YO female who comes in today for a routine visit with her PCP. She is also in the process of adopting some of her foster children and has some paperwork which she requests be filled out today. She denies any acute problems. She has not had her eyes checked in some time and did not come back to get the retinal camera picture done since last visit. She has not noticed any fevers, rashes, SOB, cough or cold symptoms. She continues to take her insulin 55 units at night and 50 units in the morning. She states that she is still taking her blood pressure medicines.   Review of Systems  Constitutional: Negative for fever, chills, diaphoresis, activity change, appetite change, fatigue and unexpected weight change.  HENT: Negative.  Negative for congestion, dental problem, drooling, ear discharge, ear pain, facial swelling, hearing loss, mouth sores, nosebleeds, postnasal drip, rhinorrhea, sinus pressure, sneezing, sore throat, tinnitus, trouble swallowing and voice change.   Eyes: Negative.   Respiratory: Negative for cough, choking, chest tightness, shortness of breath, wheezing and stridor.   Cardiovascular: Negative for chest pain, palpitations and leg swelling.  Gastrointestinal: Negative.  Negative for nausea, vomiting, abdominal pain, diarrhea, constipation and abdominal distention.  Musculoskeletal: Negative for back pain, gait problem, joint swelling, myalgias, neck pain and neck stiffness.  Skin: Negative.   Neurological: Negative for dizziness, tremors, seizures, syncope, facial asymmetry, speech difficulty, weakness, light-headedness, numbness and headaches.  Psychiatric/Behavioral: Negative.        Objective:   Physical Exam  Constitutional: She is oriented to person, place, and time. She appears well-developed and well-nourished. No distress.  HENT:  Head: Normocephalic  and atraumatic.  Eyes: EOM are normal. Pupils are equal, round, and reactive to light.  Neck: Normal range of motion. Neck supple.  Cardiovascular: Normal rate and regular rhythm.   No murmur heard. Pulmonary/Chest: Effort normal. No respiratory distress. She has no wheezes. She has no rales. She exhibits no tenderness.  Abdominal: Soft. Bowel sounds are normal. She exhibits no distension. There is no tenderness. There is no rebound.  Musculoskeletal: Normal range of motion. She exhibits no edema.  Neurological: She is alert and oriented to person, place, and time. No cranial nerve deficit.  Skin: Skin is warm and dry. She is not diaphoretic.  Psychiatric: She has a normal mood and affect. Her behavior is normal. Judgment and thought content normal.       Assessment/Plan:   1. Please see problem oriented charting.   2. Disposition - The patient will be seen back in 3 months for follow up. She will increase metformin to 500 mg BID and can increase to 1000 mg BID at next visit. HgA1c checked, foot exam done, BMP drawn. Her flu shot was given. We tried to do the retinal camera with this patient however the image was unable to be obtained due to small pupil size and therefore she was asked to return for additional try with the camera. Filled out paperwork for foster system.

## 2012-12-12 NOTE — Patient Instructions (Signed)
We will have you increase the metformin to 1 pill twice a day to help with your sugars. We would like to see you back in about 3 months to check on your sugars and your blood pressure.   We would like you to work on exercising more to help with both your sugars and your blood pressure.   Call us with questions or problems at [phone].  Good luck with your adoption!  Exercise to Lose Weight Exercise and a healthy diet may help you lose weight. Your doctor may suggest specific exercises. EXERCISE IDEAS AND TIPS  Choose low-cost things you enjoy doing, such as walking, bicycling, or exercising to workout videos.  Take stairs instead of the elevator.  Walk during your lunch break.  Park your car further away from work or school.  Go to a gym or an exercise class.  Start with 5 to 10 minutes of exercise each day. Build up to 30 minutes of exercise 4 to 6 days a week.  Wear shoes with good support and comfortable clothes.  Stretch before and after working out.  Work out until you breathe harder and your heart beats faster.  Drink extra water when you exercise.  Do not do so much that you hurt yourself, feel dizzy, or get very short of breath. Exercises that burn about 150 calories:  Running 1  miles in 15 minutes.  Playing volleyball for 45 to 60 minutes.  Washing and waxing a car for 45 to 60 minutes.  Playing touch football for 45 minutes.  Walking 1  miles in 35 minutes.  Pushing a stroller 1  miles in 30 minutes.  Playing basketball for 30 minutes.  Raking leaves for 30 minutes.  Bicycling 5 miles in 30 minutes.  Walking 2 miles in 30 minutes.  Dancing for 30 minutes.  Shoveling snow for 15 minutes.  Swimming laps for 20 minutes.  Walking up stairs for 15 minutes.  Bicycling 4 miles in 15 minutes.  Gardening for 30 to 45 minutes.  Jumping rope for 15 minutes.  Washing windows or floors for 45 to 60 minutes. Document Released: 02/24/2010  Document Revised: 04/16/2011 Document Reviewed: 02/24/2010 [facility] Patient Information 2014 [place], [place].

## 2012-12-13 NOTE — Assessment & Plan Note (Signed)
Lab Results  Component Value Date   HGBA1C 9.1 12/12/2012   HGBA1C 9.8 09/05/2012   HGBA1C 8.3 10/02/2011     Assessment: Diabetes control:  improved Progress toward A1C goal:   poor >9% Comments: she is improving but finds diet and exercise hard to do with all of her foster children  Plan: Medications:  insulin NPH 55 units qam and 50 units qhs, increase metformin to 500 mg bid Home glucose monitoring: Frequency:   Timing:   Instruction/counseling given: reminded to get eye exam, reminded to bring blood glucose meter & log to each visit, discussed foot care and discussed the need for weight loss Educational resources provided:   Self management tools provided:   Other plans: foot exam done today, BMP obtained, HgA1c checked, she is on ACE-I

## 2012-12-13 NOTE — Assessment & Plan Note (Signed)
BP Readings from Last 3 Encounters:  12/12/12 135/90  09/05/12 136/89  04/04/12 147/83    Lab Results  Component Value Date   NA 138 12/12/2012   K 4.5 12/12/2012   CREATININE 1.30* 12/12/2012    Assessment: Blood pressure control:  deteriorated Progress toward BP goal:   slightly elevated Comments:   Plan: Medications:  continue current medications, no change to regimen at today's visit but if still elevated needs additional agent at next visit Educational resources provided:   Self management tools provided:   Other plans: BMP

## 2012-12-15 ENCOUNTER — Other Ambulatory Visit: Payer: Self-pay | Admitting: Internal Medicine

## 2012-12-16 NOTE — Progress Notes (Signed)
Case discussed with Dr. Kollar at the time of the visit.  We reviewed the resident's history and exam and pertinent patient test results.  I agree with the assessment, diagnosis, and plan of care documented in the resident's note.     

## 2012-12-17 ENCOUNTER — Other Ambulatory Visit: Payer: Self-pay | Admitting: Internal Medicine

## 2012-12-18 NOTE — Telephone Encounter (Signed)
Talked to pt - she went to [facility] which refilled her rx.

## 2012-12-20 ENCOUNTER — Other Ambulatory Visit: Payer: Self-pay | Admitting: Internal Medicine

## 2012-12-22 ENCOUNTER — Ambulatory Visit: Payer: Self-pay

## 2012-12-22 NOTE — Telephone Encounter (Signed)
This medication was increased and this RX is not appropriate. Will you please call the pharmacy and let them know it is discontinued. This is the third time I have denied this medication.  Thanks,  Dr. Dorise Hiss

## 2012-12-22 NOTE — Telephone Encounter (Signed)
Karin Golden informed Metformin rx was refilled at [facility] pharmacy ; also see telephone encounter 11/13 - pt had rx refilled.

## 2012-12-23 ENCOUNTER — Telehealth: Payer: Self-pay | Admitting: *Deleted

## 2012-12-23 NOTE — Telephone Encounter (Signed)
Health dept would like to continue giving pt kwikpens instead of vials, pt likes pens, gave verbal ok, are you ok with this?

## 2012-12-24 NOTE — Telephone Encounter (Signed)
Yes, that's fine 

## 2012-12-25 ENCOUNTER — Ambulatory Visit: Payer: Self-pay

## 2012-12-25 ENCOUNTER — Ambulatory Visit: Payer: Self-pay | Admitting: Internal Medicine

## 2013-01-24 ENCOUNTER — Encounter (HOSPITAL_COMMUNITY): Payer: Self-pay | Admitting: Emergency Medicine

## 2013-01-24 ENCOUNTER — Emergency Department (INDEPENDENT_AMBULATORY_CARE_PROVIDER_SITE_OTHER)
Admission: EM | Admit: 2013-01-24 | Discharge: 2013-01-24 | Disposition: A | Discharge status: Home / Self Care | Payer: No Typology Code available for payment source | Source: Home / Self Care | Attending: Emergency Medicine | Admitting: Emergency Medicine

## 2013-01-24 DIAGNOSIS — J019 Acute sinusitis, unspecified: Secondary | ICD-10-CM

## 2013-01-24 DIAGNOSIS — J45909 Unspecified asthma, uncomplicated: Secondary | ICD-10-CM

## 2013-01-24 MED ORDER — BECLOMETHASONE DIPROPIONATE 80 MCG/ACT IN AERS: 2.0000 | INHALATION_SPRAY | Freq: Two times a day (BID) | RESPIRATORY_TRACT | Status: DC

## 2013-01-24 MED ORDER — AMOXICILLIN 500 MG PO CAPS: 1000.0000 mg | ORAL_CAPSULE | Freq: Three times a day (TID) | ORAL | Status: DC

## 2013-01-24 MED ORDER — BENZONATATE 200 MG PO CAPS: 200.0000 mg | ORAL_CAPSULE | Freq: Three times a day (TID) | ORAL | Status: DC | PRN

## 2013-01-24 NOTE — ED Provider Notes (Signed)
Chief Complaint:  No chief complaint on file.   History of Present Illness:   Regina Stevenson is a 57 year old female with asthma who has had a six-day history of dry cough, wheezing, sore throat, particularly left side, hoarseness, postnasal drip, chills, and temperature of up to 99.9. She denies any chest pain, nasal congestion, rhinorrhea, or GI symptoms. She's been using her albuterol around 4-5 times per day, although she states that her baseline is less than twice a week. Sometimes she wakes up coughing and wheezing. She was hospitalized once for asthma about 2 years ago, but was never on a ventilator. She has had no emergency room urgent care visits for her asthma within the past year. She has not had to take a course of prednisone within the past year.  Review of Systems:  Other than noted above, the patient denies any of the following symptoms: Systemic:  No fevers, chills, sweats, weight loss or gain, fatigue, or tiredness. Eye:  No redness or discharge. ENT:  No ear pain, drainage, headache, nasal congestion, drainage, sinus pressure, difficulty swallowing, or sore throat. Neck:  No neck pain or swollen glands. Lungs:  No cough, sputum production, hemoptysis, wheezing, chest tightness, shortness of breath or chest pain. GI:  No abdominal pain, nausea, vomiting or diarrhea.  PMFSH:  Past medical history, family history, social history, meds, and allergies were reviewed.  Physical Exam:   Vital signs:  BP 143/77  Pulse 89  Temp(Src) 98.2 F (36.8 C) (Oral)  Resp 18  SpO2 100%  LMP 04/23/1988 General:  Alert and oriented.  In no distress.  Skin warm and dry. Eye:  No conjunctival injection or drainage. Lids were normal. ENT:  TMs and canals were normal, without erythema or inflammation.  Nasal mucosa was clear and uncongested, without drainage.  Mucous membranes were moist.  Posterior pharynx was erythematous with cobblestoning, but with no exudate or drainage.  There were no oral  ulcerations or lesions. Neck:  Supple, no adenopathy, tenderness or mass. Lungs:  No respiratory distress.  Lungs were clear to auscultation, without wheezes, rales or rhonchi.  Breath sounds were clear and equal bilaterally.  Heart:  Regular rhythm, without gallops, murmers or rubs. Skin:  Clear, warm, and dry, without rash or lesions.  Labs:   Results for orders placed during the hospital encounter of 01/24/13  POCT RAPID STREP A ([facility] URG CARE ONLY)      Result Value Range   Streptococcus, Group A Screen (Direct) NEGATIVE  NEGATIVE    Assessment:  The primary encounter diagnosis was Acute sinusitis. A diagnosis of Asthma was also pertinent to this visit.  She doesn't have any wheezing today, so given that she is a diabetic, did not want to prescribe any oral or injectable steroids. I did give her a prescription for Qvar.  Plan:   1.  Meds:  The following meds were prescribed:   Discharge Medication List as of 01/24/2013 10:29 AM    START taking these medications   Details  amoxicillin (AMOXIL) 500 MG capsule Take 2 capsules (1,000 mg total) by mouth 3 (three) times daily., Starting 01/24/2013, Until Discontinued, Print    beclomethasone (QVAR) 80 MCG/ACT inhaler Inhale 2 puffs into the lungs 2 (two) times daily., Starting 01/24/2013, Until Discontinued, Print    benzonatate (TESSALON) 200 MG capsule Take 1 capsule (200 mg total) by mouth 3 (three) times daily as needed for cough., Starting 01/24/2013, Until Discontinued, Print        2.  Patient Education/Counseling:  The patient was given appropriate handouts, self care instructions, and instructed in symptomatic relief.   3.  Follow up:  The patient was told to follow up if no better in 3 to 4 days, if becoming worse in any way, and given some red flag symptoms such as difficulty breathing or persistent vomiting which would prompt immediate return.  Follow up here as needed.      Reuben Likes, MD 01/24/13 [phone]

## 2013-01-26 LAB — CULTURE, GROUP A STREP

## 2013-01-27 NOTE — ED Notes (Signed)
Call from patient, who is visiting in [place]. [facility] has reportedly left her amoxicillin behind at home and is requesting replacement Rx of her antibiotics only. Called and spoke directly to pharmacist at [facility] in [place] to relay information about Rx

## 2013-02-09 ENCOUNTER — Other Ambulatory Visit: Payer: Self-pay | Admitting: *Deleted

## 2013-02-09 DIAGNOSIS — E1149 Type 2 diabetes mellitus with other diabetic neurological complication: Secondary | ICD-10-CM

## 2013-02-09 MED ORDER — IPRATROPIUM-ALBUTEROL 20-100 MCG/ACT IN AERS: 1.0000 | INHALATION_SPRAY | Freq: Four times a day (QID) | RESPIRATORY_TRACT | Status: AC | PRN

## 2013-02-11 NOTE — Telephone Encounter (Signed)
Called to pharm 

## 2013-03-12 ENCOUNTER — Telehealth: Payer: Self-pay | Admitting: *Deleted

## 2013-03-12 MED ORDER — OLMESARTAN MEDOXOMIL 40 MG PO TABS: 40.0000 mg | ORAL_TABLET | Freq: Every day | ORAL | Status: AC

## 2013-03-12 NOTE — Telephone Encounter (Signed)
Fax from [facility] - Accupril is no longer available for free; consider changing to Benicar which is free? Please send new rx  Thanks

## 2013-03-12 NOTE — Telephone Encounter (Signed)
Will send new rx.   Thanks,  Dr. Dorise HissKollar

## 2013-03-20 ENCOUNTER — Encounter: Payer: Self-pay | Admitting: Internal Medicine

## 2013-03-31 ENCOUNTER — Other Ambulatory Visit: Payer: Self-pay | Admitting: *Deleted

## 2013-03-31 DIAGNOSIS — J309 Allergic rhinitis, unspecified: Secondary | ICD-10-CM

## 2013-03-31 MED ORDER — MOMETASONE FUROATE 50 MCG/ACT NA SUSP: 1.0000 | Freq: Every day | NASAL | Status: DC

## 2013-03-31 NOTE — Telephone Encounter (Signed)
Rx called in to pharmacy. 

## 2013-04-10 ENCOUNTER — Ambulatory Visit: Payer: No Typology Code available for payment source | Admitting: Internal Medicine

## 2013-04-13 ENCOUNTER — Ambulatory Visit (INDEPENDENT_AMBULATORY_CARE_PROVIDER_SITE_OTHER): Payer: No Typology Code available for payment source | Admitting: Family Medicine

## 2013-04-13 ENCOUNTER — Encounter: Payer: Self-pay | Admitting: Family Medicine

## 2013-04-13 VITALS — BP 153/84 | HR 96 | Ht 66.0 in | Wt 284.0 lb

## 2013-04-13 DIAGNOSIS — M25562 Pain in left knee: Secondary | ICD-10-CM

## 2013-04-13 DIAGNOSIS — M1610 Unilateral primary osteoarthritis, unspecified hip: Secondary | ICD-10-CM

## 2013-04-13 DIAGNOSIS — M25569 Pain in unspecified knee: Secondary | ICD-10-CM

## 2013-04-13 DIAGNOSIS — M25561 Pain in right knee: Secondary | ICD-10-CM

## 2013-04-13 DIAGNOSIS — M169 Osteoarthritis of hip, unspecified: Secondary | ICD-10-CM

## 2013-04-13 MED ORDER — METHYLPREDNISOLONE ACETATE 40 MG/ML IJ SUSP
40.0000 mg | Freq: Once | INTRAMUSCULAR | Status: AC
  Administered 2013-04-13: 40 mg via INTRA_ARTICULAR

## 2013-04-13 NOTE — Progress Notes (Signed)
   Subjective:    Patient ID: Regina Stevenson, female    DOB: 1955/11/18, 58 y.o.   MRN: [id number]  HPI  Left knee pain. She had bilateral corticosteroid injections in August of 2013. Says that her knees have been pretty well up for about the last 2 months with a left it's been bothering her. Right was not really been a problem. At the particularly bothers her with standing or stair climbing. No swelling, no knee effusion, no buckling. No new injury.  PERTINENT  PMH / PSH: Diabetes mellitus, hypertension, obesity, hyperlipidemia  Review of Systems Denies fever, sweats, chills, unusual weight change.    Objective:   Physical Exam  Vital signs are reviewed GENERAL: Well-developed obese female no acute distress KNEES: Bilaterally symmetrical. Either knee has effusion erythema or warmth. Left knee is tender to palpation at the medial joint line. Some mild crepitus on extension. She has full range of motion extension and flexion. Ligamentously intact to varus and valgus stress. Normal Lachman.   INJECTION: Patient was given informed consent, signed copy in the chart. Appropriate time out was taken. Area prepped and draped in usual sterile fashion. One cc of methylprednisolone 40 mg/ml plus  4 cc of 1% lidocaine without epinephrine was injected into the left knee using a(n) anterior medial approach. The patient tolerated the procedure well. There were no complications. Post procedure instructions were given.       Assessment & Plan:  DJD left knee. Corticosteroid injection today. She's done quite well with this. We'll follow her when necessary.

## 2013-04-14 ENCOUNTER — Ambulatory Visit: Payer: No Typology Code available for payment source | Admitting: Internal Medicine

## 2013-04-17 ENCOUNTER — Ambulatory Visit: Payer: No Typology Code available for payment source | Admitting: Family Medicine

## 2013-04-22 ENCOUNTER — Telehealth: Payer: Self-pay | Admitting: *Deleted

## 2013-04-24 NOTE — Telephone Encounter (Signed)
[place] ALL of her pain meds should come from her PCP as we do not do pain meds  Long term. THANKS! Denny LevySara Morley Gaumer

## 2013-04-30 ENCOUNTER — Ambulatory Visit (INDEPENDENT_AMBULATORY_CARE_PROVIDER_SITE_OTHER): Payer: No Typology Code available for payment source | Admitting: Internal Medicine

## 2013-04-30 ENCOUNTER — Encounter: Payer: Self-pay | Admitting: Internal Medicine

## 2013-04-30 VITALS — BP 134/80 | HR 99 | Temp 97.0°F | Ht 66.5 in | Wt 281.6 lb

## 2013-04-30 DIAGNOSIS — J4520 Mild intermittent asthma, uncomplicated: Secondary | ICD-10-CM

## 2013-04-30 DIAGNOSIS — I10 Essential (primary) hypertension: Secondary | ICD-10-CM

## 2013-04-30 DIAGNOSIS — J45909 Unspecified asthma, uncomplicated: Secondary | ICD-10-CM

## 2013-04-30 DIAGNOSIS — M25569 Pain in unspecified knee: Secondary | ICD-10-CM

## 2013-04-30 DIAGNOSIS — M25562 Pain in left knee: Secondary | ICD-10-CM

## 2013-04-30 DIAGNOSIS — E1149 Type 2 diabetes mellitus with other diabetic neurological complication: Secondary | ICD-10-CM

## 2013-04-30 DIAGNOSIS — J309 Allergic rhinitis, unspecified: Secondary | ICD-10-CM

## 2013-04-30 DIAGNOSIS — M25561 Pain in right knee: Secondary | ICD-10-CM

## 2013-04-30 LAB — HM DIABETES EYE EXAM

## 2013-04-30 LAB — POCT GLYCOSYLATED HEMOGLOBIN (HGB A1C): Hemoglobin A1C: 9.7

## 2013-04-30 LAB — GLUCOSE, CAPILLARY: Glucose-Capillary: 78 mg/dL (ref 70–99)

## 2013-04-30 MED ORDER — INSULIN ISOPHANE & REGULAR (HUMAN 70-30)100 UNIT/ML KWIKPEN: PEN_INJECTOR | SUBCUTANEOUS | Status: DC

## 2013-04-30 NOTE — Assessment & Plan Note (Signed)
BP Readings from Last 3 Encounters:  04/30/13 134/80  04/13/13 153/84  01/24/13 143/77    Lab Results  Component Value Date   NA 138 12/12/2012   K 4.5 12/12/2012   CREATININE 1.30* 12/12/2012    Assessment: Blood pressure control: controlled Progress toward BP goal:  at goal Comments:   Plan: Medications:  continue current medications, hctz 25 mg daily, benicar 40 mg daily, norvasc 10 mg daily Educational resources provided: brochure;handout;video Self management tools provided: instructions for home blood pressure monitoring Other plans:

## 2013-04-30 NOTE — Progress Notes (Signed)
Subjective:     Patient ID: Regina Stevenson, female   DOB: 1955/05/24, 58 y.o.   MRN: [id number]  HPI The patient is a 58 YO female who comes in today for a routine visit with her PCP to follow up on her diabetes. She has been to the ER since our last visit with some sore throat and is feeling better now. She denies any acute problems. She has not had her eyes checked in some time and did not come back to get the retinal camera picture done since last visit. She has not noticed any fevers, rashes, SOB, cough or cold symptoms. She continues to take her insulin 45 units at night and 55 units in the morning. She states that she is still taking her blood pressure medicines. She is in the process of adopting her foster children (6 and 9 one of which is special needs). She has her granddaughter with her today who she keeps in the mornings. She has not been exercising much and admits that her diet has not been great. She feels that she had all the information but just has a hard time putting it into action with her diet and food choices.  She forgot her meter today but states that morning sugars are around 110-130 and evening sugars are higher.   Review of Systems  Constitutional: Negative for fever, chills, diaphoresis, activity change, appetite change, fatigue and unexpected weight change.  HENT: Negative.  Negative for congestion, dental problem, drooling, ear discharge, ear pain, facial swelling, hearing loss, mouth sores, nosebleeds, postnasal drip, rhinorrhea, sinus pressure, sneezing, sore throat, tinnitus, trouble swallowing and voice change.   Eyes: Negative.   Respiratory: Negative for cough, choking, chest tightness, shortness of breath, wheezing and stridor.   Cardiovascular: Negative for chest pain, palpitations and leg swelling.  Gastrointestinal: Negative.  Negative for nausea, vomiting, abdominal pain, diarrhea, constipation and abdominal distention.  Musculoskeletal: Negative for back pain, gait  problem, joint swelling, myalgias, neck pain and neck stiffness.  Skin: Negative.   Neurological: Negative for dizziness, tremors, seizures, syncope, facial asymmetry, speech difficulty, weakness, light-headedness, numbness and headaches.  Psychiatric/Behavioral: Negative.        Objective:   Physical Exam  Constitutional: She is oriented to person, place, and time. She appears well-developed and well-nourished. No distress.  HENT:  Head: Normocephalic and atraumatic.  Eyes: EOM are normal. Pupils are equal, round, and reactive to light.  Neck: Normal range of motion. Neck supple.  Cardiovascular: Normal rate and regular rhythm.   No murmur heard. Pulmonary/Chest: Effort normal. No respiratory distress. She has no wheezes. She has no rales. She exhibits no tenderness.  Abdominal: Soft. Bowel sounds are normal. She exhibits no distension. There is no tenderness. There is no rebound.  Musculoskeletal: Normal range of motion. She exhibits no edema.  Neurological: She is alert and oriented to person, place, and time. No cranial nerve deficit.  Skin: Skin is warm and dry. She is not diaphoretic.  Psychiatric: She has a normal mood and affect. Her behavior is normal. Judgment and thought content normal.       Assessment/Plan:   1. Please see problem oriented charting.   2. Disposition - The patient will be seen back in 1-2 months for close follow up of her sugars and she will try to bring her meter to next visit. She will increase insulin 70/30 to 60 units in the morning and 45 at night. Refill and new rx provided for health department. She was reminded  to exercise and work on diet. HgA1c checked. We tried to do the retinal camera with this patient today.

## 2013-04-30 NOTE — Assessment & Plan Note (Signed)
Patient is not taking her metformin due to diarrhea and advised her to try taking it at least once per day if she is able to tolerate. She will increase her insulin 70/30 to 60 units in the morning and 45 units at night time. She will return in 1-2 months for sugar recheck and will try to bring her meter. Advised her that her diet choices and exercise will likely help as well. She has limited oral options given her Cr 1.3 and diarrhea with metformin. She has limited financial resources which limits our other options.

## 2013-04-30 NOTE — Patient Instructions (Signed)
We will have you increase your insulin to 60 units in the morning and keep 45 units at night time. Please bring your meter to your next visit so we can adjust your insulin. You need to start exercising to help your body with your sugars.   Take tylenol for your knees if that will help. You can also use heat or ice to help them feel better in the evening.   We would like you to come back in about 1-2 months so we can adjust your sugars but you need to bring your meter with you. We would like you to check your sugar when you wake up and in the evening and any other time you think your sugar is going too low.   Come back in 1-2 months for a recheck and call us with problems before then at [phone].  Please bring your medicines with you each time you come.   Medicines may be  Eye drops  Herbal   Vitamins  Pills  Seeing these help us take care of you.  Basic Carbohydrate Counting Basic carbohydrate counting is a way to plan meals. It is done by counting the amount of carbohydrate in foods. Foods that have carbohydrates are starches (grains, beans, starchy vegetables) and sweets. Eating carbohydrates increases blood glucose (sugar) levels. People with diabetes use carbohydrate counting to help keep their blood glucose at a normal level.  COUNTING CARBOHYDRATES IN FOODS The first step in counting carbohydrates is to learn how many carbohydrate servings you should have in every meal. A dietitian can plan this for you. After learning the amount of carbohydrates to include in your meal plan, you can start to choose the carbohydrate-containing foods you want to eat.  There are 2 ways to identify the amount of carbohydrates in the foods you eat.  Read the Nutrition Facts panel on food labels. You need 2 pieces of information from the Nutrition Facts panel to count carbohydrates this way:  Serving size.  Total carbohydrate (in grams). Decide how many servings you will be eating. If it is 1  serving, you will be eating the amount of carbohydrate listed on the panel. If you will be eating 2 servings, you will be eating double the amount of carbohydrate listed on the panel.   Learn serving sizes. A serving size of most carbohydrate-containing foods is about 15 grams (g). Listed below are single serving sizes of common carbohydrate-containing foods:  1 slice bread.   cup unsweetened, dry cereal.   cup hot cereal.   cup rice.   cup mashed potatoes.   cup pasta.  1 cup fresh fruit.   cup canned fruit.  1 cup milk (whole, 2%, or skim).   cup starchy vegetables (peas, corn, or potatoes). Counting carbohydrates this way is similar to looking on the Nutrition Facts panel. Decide how many servings you will eat first. Multiply the number of servings you eat by 15 g. For example, if you have 2 cups of strawberries, you had 2 servings. That means you had 30 g of carbohydrate (2 servings x 15 g = 30 g). CALCULATING CARBOHYDRATES IN A MEAL Sample dinner  3 oz chicken breast.   cup brown rice.   cup corn.  1 cup fat-free milk.  1 cup strawberries with sugar-free whipped topping. Carbohydrate calculation First, identify the foods that contain carbohydrate:  Rice.  Corn.  Milk.  Strawberries. Calculate the number of servings eaten:  2 servings rice.  1 serving corn.  1  serving milk.  1 serving strawberries. Multiply the number of servings by 15 g:  2 servings rice x 15 g = 30 g.  1 serving corn x 15 g = 15 g.  1 serving milk x 15 g = 15 g.  1 serving strawberries x 15 g = 15 g. Add the amounts to find the total carbohydrates eaten: 30 g + 15 g + 15 g + 15 g = 75 g carbohydrate eaten at dinner. Document Released: 01/22/2005 Document Revised: 04/16/2011 Document Reviewed: 12/08/2010 [facility] Patient Information 2014 [place], [place].

## 2013-04-30 NOTE — Assessment & Plan Note (Signed)
Asthma is mild, intermittent and she has not used the albuterol in the last week. She has not had any flares in the last year requiring corticosteroids. No night time symptoms although her allergies are not great right now.

## 2013-04-30 NOTE — Assessment & Plan Note (Signed)
Advised not to take alleve for the knee pain unless necessary but to try tylenol instead as it is safer for her kidneys.

## 2013-04-30 NOTE — Assessment & Plan Note (Signed)
She will start being diligent about her clarinex and her nasal corticosteroid while pollen levels are so high.

## 2013-05-04 NOTE — Progress Notes (Signed)
Case discussed with Dr. Kollar soon after the resident saw the patient.  We reviewed the resident's history and exam and pertinent patient test results.  I agree with the assessment, diagnosis and plan of care documented in the resident's note. 

## 2013-05-04 NOTE — Addendum Note (Signed)
Addended by: Remus BlakeBARROW, Nandan Willems K on: 05/04/2013 10:11 AM   Modules accepted: Orders

## 2013-05-05 ENCOUNTER — Other Ambulatory Visit: Payer: Self-pay | Admitting: *Deleted

## 2013-05-05 MED ORDER — IPRATROPIUM-ALBUTEROL 18-103 MCG/ACT IN AERO: 1.0000 | INHALATION_SPRAY | Freq: Four times a day (QID) | RESPIRATORY_TRACT | Status: AC | PRN

## 2013-05-06 NOTE — Telephone Encounter (Signed)
Called to pharm 

## 2013-06-08 ENCOUNTER — Telehealth: Payer: Self-pay | Admitting: *Deleted

## 2013-06-08 NOTE — Telephone Encounter (Signed)
Hey, pt needs a refill of crestor, not on med list, looks like you changed it 7[facility]22-23/2014 [facility], could you please refill the crestor 10mg  #90w/ 4 refills and change the med list, thanks, h.

## 2013-06-10 NOTE — Telephone Encounter (Signed)
She did not have orange card at last visit and so did not give crestor (very expensive). If she has proof of orange card can do this rx. Will not send in until new orange card.  Dr. Dorise HissKollar

## 2013-06-11 ENCOUNTER — Encounter: Payer: No Typology Code available for payment source | Admitting: Internal Medicine

## 2013-06-23 ENCOUNTER — Telehealth: Payer: Self-pay | Admitting: *Deleted

## 2013-06-23 ENCOUNTER — Other Ambulatory Visit: Payer: Self-pay | Admitting: Internal Medicine

## 2013-06-23 MED ORDER — INSULIN NPH (HUMAN) (ISOPHANE) 100 UNIT/ML ~~LOC~~ SUSP: SUBCUTANEOUS | Status: DC

## 2013-06-23 NOTE — Telephone Encounter (Addendum)
Health dept calls and states pt has been on humulin N and now they were given order to increase 70/30,   Will call pt and schedule appt tomorrow due to pt being out of insulin. Left message for pt to rtc

## 2013-06-25 ENCOUNTER — Ambulatory Visit: Payer: No Typology Code available for payment source

## 2013-06-25 ENCOUNTER — Telehealth: Payer: Self-pay | Admitting: *Deleted

## 2013-06-25 NOTE — Telephone Encounter (Signed)
Please clarify type insulin and dose.   Your last note from 3/26 office visit states "will increase her insulin 70/30 to 60 units in the morning and 45 units at night time."  You ordered on 5/19 NPH 45 units in AM and 55 units in PM  Pt states she takes Humulin N 60 units in am and 45 units at night.

## 2013-06-25 NOTE — Telephone Encounter (Signed)
This is why she needs to be seen in clinic. Please advise her to bring her insulin if possible. She told me at last visit that she was taking 70/30. This is why it was reordered at last visit. Her pharmacy called stating she had been NPH (or humulin N) so sent in rx for last dosing.   Dr. Dorise HissKollar

## 2013-06-26 NOTE — Telephone Encounter (Signed)
Pt called and she states she has never taken a 70/30 insulin.  Her dose was changed at last visit and that is what she is taking. She would rather you call her on the phone vs a clinic visit as she was her at the end of March.

## 2013-06-26 NOTE — Telephone Encounter (Signed)
Okay, will have front office schedule appointment.

## 2013-06-26 NOTE — Telephone Encounter (Signed)
She does need to be seen in clinic and was supposed to return in 1-2 months from March. She can continue taking 60 units in the morning and 45 units at night time. I may have written the wrong dose on the new rx sent to health department.   Thanks,  Dr. Dorise Hiss

## 2013-07-03 NOTE — Progress Notes (Signed)
closed

## 2013-07-06 ENCOUNTER — Telehealth: Payer: Self-pay | Admitting: *Deleted

## 2013-07-06 NOTE — Telephone Encounter (Signed)
Call from pt - f/u about what to do about her insulin. According to last note, Dr Dorise Hiss wants pt to schedule an appt  And to bring her insulin. Pt has scheduled an apppt for 6/3.

## 2013-07-08 ENCOUNTER — Ambulatory Visit: Payer: No Typology Code available for payment source | Admitting: Internal Medicine

## 2013-07-15 ENCOUNTER — Encounter: Payer: Self-pay | Admitting: Internal Medicine

## 2013-07-15 ENCOUNTER — Telehealth: Payer: Self-pay | Admitting: *Deleted

## 2013-07-15 ENCOUNTER — Ambulatory Visit (INDEPENDENT_AMBULATORY_CARE_PROVIDER_SITE_OTHER): Payer: No Typology Code available for payment source | Admitting: Internal Medicine

## 2013-07-15 VITALS — BP 148/81 | HR 100 | Temp 97.4°F | Wt 286.3 lb

## 2013-07-15 DIAGNOSIS — E785 Hyperlipidemia, unspecified: Secondary | ICD-10-CM

## 2013-07-15 DIAGNOSIS — N183 Chronic kidney disease, stage 3 unspecified: Secondary | ICD-10-CM

## 2013-07-15 DIAGNOSIS — D509 Iron deficiency anemia, unspecified: Secondary | ICD-10-CM | POA: Insufficient documentation

## 2013-07-15 DIAGNOSIS — R7402 Elevation of levels of lactic acid dehydrogenase (LDH): Secondary | ICD-10-CM

## 2013-07-15 DIAGNOSIS — I129 Hypertensive chronic kidney disease with stage 1 through stage 4 chronic kidney disease, or unspecified chronic kidney disease: Secondary | ICD-10-CM

## 2013-07-15 DIAGNOSIS — I519 Heart disease, unspecified: Secondary | ICD-10-CM

## 2013-07-15 DIAGNOSIS — I5189 Other ill-defined heart diseases: Secondary | ICD-10-CM | POA: Insufficient documentation

## 2013-07-15 DIAGNOSIS — E1149 Type 2 diabetes mellitus with other diabetic neurological complication: Secondary | ICD-10-CM

## 2013-07-15 DIAGNOSIS — K3184 Gastroparesis: Secondary | ICD-10-CM

## 2013-07-15 DIAGNOSIS — R7401 Elevation of levels of liver transaminase levels: Secondary | ICD-10-CM

## 2013-07-15 DIAGNOSIS — I10 Essential (primary) hypertension: Secondary | ICD-10-CM

## 2013-07-15 DIAGNOSIS — J4520 Mild intermittent asthma, uncomplicated: Secondary | ICD-10-CM

## 2013-07-15 DIAGNOSIS — J45909 Unspecified asthma, uncomplicated: Secondary | ICD-10-CM

## 2013-07-15 DIAGNOSIS — D649 Anemia, unspecified: Secondary | ICD-10-CM

## 2013-07-15 DIAGNOSIS — R740 Nonspecific elevation of levels of transaminase and lactic acid dehydrogenase [LDH]: Secondary | ICD-10-CM

## 2013-07-15 LAB — POCT GLYCOSYLATED HEMOGLOBIN (HGB A1C): Hemoglobin A1C: 9.4

## 2013-07-15 LAB — COMPLETE METABOLIC PANEL WITH GFR
ALBUMIN: 4.3 g/dL (ref 3.5–5.2)
ALT: 45 U/L — AB (ref 0–35)
AST: 31 U/L (ref 0–37)
Alkaline Phosphatase: 214 U/L — ABNORMAL HIGH (ref 39–117)
BUN: 15 mg/dL (ref 6–23)
CALCIUM: 10 mg/dL (ref 8.4–10.5)
CHLORIDE: 99 meq/L (ref 96–112)
CO2: 27 meq/L (ref 19–32)
CREATININE: 1.16 mg/dL — AB (ref 0.50–1.10)
GFR, EST AFRICAN AMERICAN: 60 mL/min
GFR, Est Non African American: 52 mL/min — ABNORMAL LOW
Glucose, Bld: 189 mg/dL — ABNORMAL HIGH (ref 70–99)
POTASSIUM: 4.2 meq/L (ref 3.5–5.3)
Sodium: 136 mEq/L (ref 135–145)
Total Bilirubin: 0.4 mg/dL (ref 0.2–1.2)
Total Protein: 7.4 g/dL (ref 6.0–8.3)

## 2013-07-15 LAB — CBC
HCT: 36.7 % (ref 36.0–46.0)
Hemoglobin: 12.1 g/dL (ref 12.0–15.0)
MCH: 24.6 pg — AB (ref 26.0–34.0)
MCHC: 33 g/dL (ref 30.0–36.0)
MCV: 74.6 fL — ABNORMAL LOW (ref 78.0–100.0)
PLATELETS: 338 10*3/uL (ref 150–400)
RBC: 4.92 MIL/uL (ref 3.87–5.11)
RDW: 15.8 % — AB (ref 11.5–15.5)
WBC: 9.1 10*3/uL (ref 4.0–10.5)

## 2013-07-15 LAB — GLUCOSE, CAPILLARY: GLUCOSE-CAPILLARY: 191 mg/dL — AB (ref 70–99)

## 2013-07-15 MED ORDER — GLUCOSE BLOOD VI STRP: ORAL_STRIP | Status: DC

## 2013-07-15 MED ORDER — ROSUVASTATIN CALCIUM 10 MG PO TABS: 10.0000 mg | ORAL_TABLET | Freq: Every day | ORAL | Status: AC

## 2013-07-15 MED ORDER — INSULIN NPH (HUMAN) (ISOPHANE) 100 UNIT/ML ~~LOC~~ SUSP: SUBCUTANEOUS | Status: AC

## 2013-07-15 MED ORDER — GLUCOSE BLOOD VI STRP: ORAL_STRIP | Status: AC

## 2013-07-15 MED ORDER — ATORVASTATIN CALCIUM 10 MG PO TABS: 10.0000 mg | ORAL_TABLET | Freq: Every day | ORAL | Status: DC

## 2013-07-15 MED ORDER — AMLODIPINE BESYLATE 10 MG PO TABS: 10.0000 mg | ORAL_TABLET | Freq: Every day | ORAL | Status: AC

## 2013-07-15 MED ORDER — HYDROCHLOROTHIAZIDE 25 MG PO TABS: 25.0000 mg | ORAL_TABLET | Freq: Every day | ORAL | Status: AC

## 2013-07-15 NOTE — Assessment & Plan Note (Signed)
Assessment: Pt with last lipid panel on 09/05/12 with hypertriglyceridemia  compliant with moderate intensity statin therapy with ASCVD 10-year risk of 21.4% and lifetime risk of 50%.    Plan: -LDL 98 at goal <100 -Change atorvastatin 10 mg daily to rosuvastatin 10 mg daily due to MAP preference -Obtain CMP  -Monitor for myalgias

## 2013-07-15 NOTE — Assessment & Plan Note (Signed)
Assessment: Pt with 2D-echo in 2013 revealing grade 1 diastolic dysfunction who presents who chronic dyspnea on exertion without other symptoms of chronic CHF.   Plan:  -Consider repeat 2D-echo and treatment for chronic CHF if symptoms persist

## 2013-07-15 NOTE — Assessment & Plan Note (Signed)
Assessment: Pt with last A1c of 9.7 on 04/30/13 compliant with insulin therapy with no recent hypoglycemic episodes who presents with CBG of 191 and A1c improved to 9.4.    Plan:  -A1c 9.4 not at goal <7, continue newly increased insulin NPH 60 U AM & 45 U PM -BP 148/61 near goal  <140/90, continue HCTZ 25 mg daily, amlodipine 10 mg daily, and olmesartan 40 mg daily  -LDL 98, at goal <100, change atorvastatin 10 mg daily to rosuvastatin 10 mg daily due to MAP preference  -Last annual foot exam November 2014 and eye exam March 2015 -BMI 45.52 not at goal <25, encourage weight loss  -Consider daily 81 mg aspirin for primary CVD prevention

## 2013-07-15 NOTE — Assessment & Plan Note (Signed)
Assessment: Pt with moderately well-controlled hypertension compliant with three-class (diuretic, ARB, CCB) anti-hypertensive therapy who presents with blood pressure of 148/61.   Plan: -BP 148/61 near goal <140/90 -Continue HCTZ 25 mg daily -Continue amlodipine 10 mg daily -Continue olmesartan 40 mg daily  -Obtain CMP (last BMP 12/12/12)

## 2013-07-15 NOTE — Progress Notes (Signed)
Patient ID: Regina Stevenson, female   DOB: 07-Jan-1956, 58 y.o.   MRN: [id number]   Subjective:   Patient ID: Regina Stevenson female   DOB: 30-Nov-1955 58 y.o.   MRN: [id number]  HPI: [name] is a 58 y.o. very pleasant woman with past medical history of insulin-dependent Type II DM, hypertension, hyperlipidemia, CKD Stage 3, mild intermittent asthma, OA, AR, and GERD who presents for routine follow-up visit.   Pt with last A1c of 9.7 on 04/30/13. Pt reports being prescribed Novolog 70/30 60 U AM  and 45 U PM at last visit in March but was on NPH previously 50 U AM and 45 U PM. She just was able to obtain the NPH 60 U AM and 45 U PM last week which she reports compliant with. She reports checking her blood glucose about one times daily with average glucose of 216 which has been improving since starting the newly increased insulin dosage. She has baseline polydipsia and polyphagia with no recent hypoglycemic events, polyuria, vision changes, paraesthesias, or foot injuries. She tries to follow a carbohydrate modified diet and stay active. She reports her weight is stable (however per records her weight has increased from 281 to 286 lb).   She reports taking HCTZ 25 mg daily and olmesartan 40 mg daily but has been out of norvasc 10 mg daily for the past few days. She denies chest pain, headache, blurry vision, leg swelling, or lightheadedness.  She has been compliant with taking lipitor 10 mg daily and denies muscle cramping or spasms.    She reports no recent asthma exacerbation and reports using combivent about x3 weekly. She reports chronic dyspnea with exertion without orthopnea, PND, LE swelling, fatigue, or weight gain.     Past Medical History  Diagnosis Date  . Diabetic gastroparesis associated with type 2 diabetes mellitus   . Diabetes mellitus     HgA1c 8.1 in 12/2009  . Hyperlipidemia   . Hypertension   . Allergic rhinitis   . Obstructive sleep apnea     Moderately severe obstructive  sleep apnea/hypopnea syndrome per Sleep study 04/2009. Patient declined CPAP use (although recommended) secondary to claustrophobia. This did resolved after tonsillectomy in 2005.  Marland Kitchen Fatty liver disease, nonalcoholic   . Obesity   . Cholelithiasis     no symptoms   Current Outpatient Prescriptions  Medication Sig Dispense Refill  . albuterol-ipratropium (COMBIVENT) 18-103 MCG/ACT inhaler Inhale 1-2 puffs into the lungs every 6 (six) hours as needed for wheezing.  3 Inhaler  3  . amLODipine (NORVASC) 10 MG tablet Take 1 tablet (10 mg total) by mouth daily.  90 tablet  2  . atorvastatin (LIPITOR) 10 MG tablet Take 1 tablet (10 mg total) by mouth daily.  90 tablet  3  . desloratadine (CLARINEX) 5 MG tablet Take 1 tablet (5 mg total) by mouth daily.  90 tablet  3  . hydrochlorothiazide (HYDRODIURIL) 25 MG tablet Take 1 tablet (25 mg total) by mouth daily.  90 tablet  1  . Insulin Isophane & Regular Human (HUMULIN 70/30 MIX) (70-30) 100 UNIT/ML PEN Inject 60 units in the morning and 45 units before evening meal.  45 mL  11  . insulin NPH Human (HUMULIN N,NOVOLIN N) 100 UNIT/ML injection 45 units in the morning and 55 units in the evening  12 mL  0  . Ipratropium-Albuterol (COMBIVENT RESPIMAT) 20-100 MCG/ACT AERS respimat Inhale 1 puff into the lungs 4 (four) times daily as needed for wheezing or  shortness of breath (Do not exceed 6 doses in 24 hours.).  3 Inhaler  0  . metFORMIN (GLUCOPHAGE) 500 MG tablet Take 1 tablet (500 mg total) by mouth 2 (two) times daily with a meal.  60 tablet  3  . mometasone (NASONEX) 50 MCG/ACT nasal spray Place 1 spray into the nose daily.  17 g  3  . olmesartan (BENICAR) 40 MG tablet Take 1 tablet (40 mg total) by mouth daily.  30 tablet  3  . pantoprazole (PROTONIX) 40 MG tablet Take 1 tablet (40 mg total) by mouth daily.  90 tablet  3  . traMADol (ULTRAM) 50 MG tablet Take 1 tablet (50 mg total) by mouth every 6 (six) hours as needed for pain.  50 tablet  2   No  current facility-administered medications for this visit.   Family History  Problem Relation Age of Onset  . Coronary artery disease Mother     MI age 54yo, deceased  . Diabetes Mother   . Heart attack Mother   . Coronary artery disease Father     MI age 75s, deceased  . Heart attack Father   . Diabetes type II    . Hypertension    . Breast cancer     History   Social History  . Marital Status: Divorced    Spouse Name: N/A    Number of Children: N/A  . Years of Education: 10   Occupational History  .     Social History Main Topics  . Smoking status: Never Smoker   . Smokeless tobacco: Never Used  . Alcohol Use: No  . Drug Use: No  . Sexual Activity: No   Other Topics Concern  . None   Social History Narrative   Nonsmoker, no drug use, no alcohol use.    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  HENT: Positive for congestion (resolved). Negative for sore throat.        Post-nasal drip  Eyes: Negative for blurred vision.  Respiratory: Positive for cough (resolved) and shortness of breath (with exertion). Negative for wheezing.   Cardiovascular: Negative for chest pain, palpitations, orthopnea, leg swelling and PND.  Gastrointestinal: Positive for nausea (resolved), vomiting (resolvd) and diarrhea. Negative for constipation and blood in stool.  Genitourinary: Negative for dysuria, urgency, frequency and hematuria.  Neurological: Negative for dizziness, sensory change and headaches.  Endo/Heme/Allergies: Positive for environmental allergies and polydipsia (chronic).  Psychiatric/Behavioral: Negative for depression. The patient does not have insomnia.     Objective:  Physical Exam: Filed Vitals:   07/15/13 0838  BP: 148/81  Pulse: 100  Temp: 97.4 F (36.3 C)  TempSrc: Oral  Weight: 286 lb 4.8 oz (129.865 kg)  SpO2: 97%     Physical Exam  Constitutional: She is oriented to person, place, and time. She appears well-developed and well-nourished. No distress.  HENT:  Head: Normocephalic and atraumatic.  Right Ear: External ear normal.  Left Ear: External ear normal.  Nose: Nose normal.  Mouth/Throat: Oropharynx is clear and moist. No oropharyngeal exudate.  Eyes: Conjunctivae and EOM are normal. Pupils are equal, round, and reactive to light. Right eye exhibits no discharge. Left eye exhibits no discharge. No scleral icterus.  Neck: Normal range of motion. Neck supple.  Cardiovascular: Normal rate, regular rhythm and normal heart sounds.   Pulmonary/Chest: Effort normal and breath  sounds normal. No respiratory distress. She has no wheezes. She has no rales.  Abdominal: Soft. Bowel sounds are normal. She exhibits no distension. There is no tenderness. There is no rebound and no guarding.  Musculoskeletal: Normal range of motion. She exhibits no edema and no tenderness.  Neurological: She is alert and oriented to person, place, and time.  Skin: Skin is warm and dry. No rash noted. She is not diaphoretic. No erythema. No pallor.  Psychiatric: She has a normal mood and affect. Her behavior is normal. Judgment and thought content normal.    Assessment & Plan:   Please see problem list for problem-based assessment and plan

## 2013-07-15 NOTE — Assessment & Plan Note (Addendum)
Assessment: Pt with mild intermittent asthma with no recent exacerbations who presents with chronic dyspnea with exertion.    Plan:  -Continue ipatroprium-albuterol inhaler PRN -Consider pulmonary function testing

## 2013-07-15 NOTE — Patient Instructions (Signed)
-  Please take NPH Insulin 60 U in AM and 45 U PM -Continue taking your other medications -Your A1c improved to 9.4 today from 9.7 in March, great job! -Will check your blood work today and call you if any abnormal results -Will see you back in 3 months, nice meeting you!   General Instructions:   Thank you for bringing your medicines today. This helps [place] keep you safe from mistakes.   Progress Toward Treatment Goals:  Treatment Goal 04/30/2013  Hemoglobin A1C deteriorated  Blood pressure at goal    Self Care Goals & Plans:  Self Care Goal 04/30/2013  Manage my medications take my medicines as prescribed; bring my medications to every visit; refill my medications on time; follow the sick day instructions if I am sick  Monitor my health check my feet daily  Eat healthy foods drink diet soda or water instead of juice or soda; eat more vegetables; eat baked foods instead of fried foods; eat fruit for snacks and desserts; eat smaller portions  Be physically active find an activity I enjoy    Home Blood Glucose Monitoring 04/30/2013  Check my blood sugar 2 times a day  When to check my blood sugar before breakfast; before dinner     Care Management & Community Referrals:  Referral 04/30/2013  Referrals made for care management support none needed

## 2013-07-15 NOTE — Telephone Encounter (Signed)
Call from [facility] at [facility] - states Lipitor is no longer free from the [facility] program.  Pt can get Crestor for free; she last got Crestor 10mg  on Mar 16, 2013 and she has a supply waiting for her; they need a new rx fax to them.  Thanks

## 2013-07-15 NOTE — Assessment & Plan Note (Addendum)
Assessment: Pt with chronic microcytic anemia s/p hysterectomy due to uterine fibroids with last colonoscopy 2006 with diverticulosis with no active bleeding or hemodynamic instability.    Plan:  -Obtain CBC --> Hg stable 12.1  -Consider anemia panel (no prior records)

## 2013-07-16 NOTE — Telephone Encounter (Signed)
Changed per Dr Johna Roles.

## 2013-07-17 NOTE — Progress Notes (Signed)
Case discussed with Dr. Rabbani at the time of the visit.  We reviewed the resident's history and exam and pertinent patient test results.  I agree with the assessment, diagnosis, and plan of care documented in the resident's note. 

## 2013-08-06 DIAGNOSIS — R740 Nonspecific elevation of levels of transaminase and lactic acid dehydrogenase [LDH]: Secondary | ICD-10-CM

## 2013-08-06 DIAGNOSIS — N183 Chronic kidney disease, stage 3 unspecified: Secondary | ICD-10-CM | POA: Insufficient documentation

## 2013-08-06 DIAGNOSIS — R7401 Elevation of levels of liver transaminase levels: Secondary | ICD-10-CM | POA: Insufficient documentation

## 2013-08-06 NOTE — Assessment & Plan Note (Addendum)
Assessment: Pt with history of cholelithiasis on 01/09/08 US abdomen with chronically mildly elevated ALT since 03/2011.  Plan: -Obtain CMP ---> ALT mildly elevated at 45 near baseline  -Consider obtaining acute hepatitis panel and HIV Ab at next visit

## 2013-08-06 NOTE — Assessment & Plan Note (Addendum)
Assessment: Pt with CKD Stage 3 with baseline Cr 1.1-1.3 who presents with stable renal function.   Plan:  -Obtain CMP ---> Cr 1.16 at baseline, GFR 60 -Avoid NSAID use

## 2013-09-18 ENCOUNTER — Other Ambulatory Visit: Payer: Self-pay | Admitting: *Deleted

## 2013-09-21 MED ORDER — MOMETASONE FUROATE 50 MCG/ACT NA SUSP: 1.0000 | Freq: Every day | NASAL | Status: AC

## 2013-09-28 NOTE — Telephone Encounter (Signed)
Rx called in 

## 2013-11-03 ENCOUNTER — Encounter: Payer: Self-pay | Admitting: *Deleted

## 2015-02-25 ENCOUNTER — Encounter: Payer: Self-pay | Admitting: Gastroenterology
